# Patient Record
Sex: Female | Born: 1975 | Hispanic: No | Marital: Single | Smoking: Never smoker
Health system: Southern US, Community
[De-identification: ages and names within clinical notes are randomized; demographics above are authoritative.]

## PROBLEM LIST (undated history)

## (undated) ENCOUNTER — Inpatient Hospital Stay (HOSPITAL_COMMUNITY): Payer: Self-pay

## (undated) DIAGNOSIS — Z789 Other specified health status: Secondary | ICD-10-CM

## (undated) HISTORY — PX: NO PAST SURGERIES: SHX2092

---

## 2001-11-02 ENCOUNTER — Observation Stay (HOSPITAL_COMMUNITY): Admission: EM | Admit: 2001-11-02 | Discharge: 2001-11-02 | Payer: Self-pay | Admitting: Emergency Medicine

## 2001-11-12 ENCOUNTER — Other Ambulatory Visit: Admission: RE | Admit: 2001-11-12 | Discharge: 2001-11-12 | Payer: Self-pay | Admitting: *Deleted

## 2002-10-17 ENCOUNTER — Other Ambulatory Visit: Admission: RE | Admit: 2002-10-17 | Discharge: 2002-10-17 | Payer: Self-pay | Admitting: *Deleted

## 2003-04-11 ENCOUNTER — Inpatient Hospital Stay (HOSPITAL_COMMUNITY): Admission: AD | Admit: 2003-04-11 | Discharge: 2003-04-12 | Payer: Self-pay | Admitting: Obstetrics & Gynecology

## 2017-01-03 ENCOUNTER — Encounter: Payer: Self-pay | Admitting: Obstetrics and Gynecology

## 2017-01-03 ENCOUNTER — Ambulatory Visit (INDEPENDENT_AMBULATORY_CARE_PROVIDER_SITE_OTHER): Payer: Self-pay | Admitting: Obstetrics and Gynecology

## 2017-01-03 DIAGNOSIS — Z113 Encounter for screening for infections with a predominantly sexual mode of transmission: Secondary | ICD-10-CM

## 2017-01-03 DIAGNOSIS — Z124 Encounter for screening for malignant neoplasm of cervix: Secondary | ICD-10-CM

## 2017-01-03 DIAGNOSIS — O09521 Supervision of elderly multigravida, first trimester: Secondary | ICD-10-CM

## 2017-01-03 DIAGNOSIS — O09529 Supervision of elderly multigravida, unspecified trimester: Secondary | ICD-10-CM

## 2017-01-03 MED ORDER — ASPIRIN EC 81 MG PO TBEC: 81.0000 mg | DELAYED_RELEASE_TABLET | Freq: Every day | ORAL | 2 refills | Status: DC

## 2017-01-03 NOTE — Patient Instructions (Signed)
Primer trimestre de Media planner (First Trimester of Pregnancy) El primer trimestre de Media planner se extiende desde la semana1 hasta el final de la semana12 (mes1 al mes3). Una semana despus de que un espermatozoide fecunda un vulo, este se implantar en la pared uterina. Este embrin comenzar a Medical laboratory scientific officer convertirse en un beb. Sus genes y los de su pareja forman el beb. Los genes del varn determinan si ser un nio o una nia. Entre la semana6 y [place], se forman los ojos y [place], y los latidos del corazn pueden verse en la ecografa. Al final de las 12semanas, todos los rganos del beb estn formados. Ahora que est embarazada, querr hacer todo lo que est a su alcance para tener un beb sano. Dos de las cosas ms importantes son Lucilla Edin buena atencin prenatal y seguir las indicaciones del mdico. La atencin prenatal incluye toda la asistencia mdica que usted recibe antes del nacimiento del beb. Esta ayudar a prevenir, Hydrographic surveyor y tratar cualquier problema durante el embarazo y C[place]. CAMBIOS EN EL ORGANISMO Su organismo atraviesa por muchos cambios durante el [place], y estos varan de Ardelia Mems mujer a Theatre manager.  Al principio, puede aumentar o bajar algunos kilos.  Puede tener Higher education careers adviser (nuseas) y vomitar. Si no puede controlar los vmitos, llame al mdico.  Puede cansarse con facilidad.  Es posible que tenga dolores de cabeza que pueden aliviarse con los medicamentos que el mdico le permita tomar.  Puede orinar con mayor frecuencia. El dolor al orinar puede significar que usted tiene una infeccin de la vejiga.  Debido al Glennis Brink, puede tener acidez estomacal.  Puede estar estreida, ya que ciertas hormonas enlentecen los movimientos de los msculos que [employer] desechos a travs de los intestinos.  Pueden aparecer hemorroides o abultarse e hincharse las venas (venas varicosas).  Las [employer] pueden empezar a Engineer, site y Scientist, forensic. Los pezones  pueden sobresalir ms, y el tejido que los rodea (areola) tornarse ms oscuro.  Las Production manager y estar sensibles al cepillado y al hilo dental.  Pueden aparecer zonas oscuras o manchas (cloasma, mscara del Media planner) en el rostro que probablemente se atenuarn despus del nacimiento del beb.  Los perodos menstruales se interrumpirn.  Tal vez no tenga apetito.  Puede sentir un fuerte deseo de consumir ciertos alimentos.  Puede tener cambios a Engineer, site a da, por ejemplo, por momentos puede estar emocionada por el Media planner y por otros preocuparse porque algo pueda salir mal con el embarazo o el beb.  Tendr sueos ms vvidos y extraos.  Tal vez haya cambios en el cabello que pueden incluir su engrosamiento, crecimiento rpido y cambios en la textura. A algunas mujeres tambin se les cae el cabello durante o despus del [place], o tienen el cabello seco o fino. Lo ms probable es que el cabello se le normalice despus del nacimiento del beb. QU DEBE ESPERAR EN LAS CONSULTAS PRENATALES Durante una visita prenatal de rutina:  La pesarn para asegurarse de que usted y el beb estn creciendo normalmente.  Le controlarn la presin arterial.  Le medirn el abdomen para controlar el desarrollo del beb.  Se escucharn los latidos cardacos a partir de la semana10 o la12 de embarazo, aproximadamente.  Se analizarn los resultados de los estudios solicitados en visitas anteriores. El mdico puede preguntarle:  Cmo se siente.  Si siente los movimientos del beb.  Si ha tenido sntomas anormales, como prdida de lquido, [place], dolores de cabeza intensos o clicos abdominales.  Si est consumiendo algn producto que contenga tabaco, como cigarrillos, tabaco de Higher education careers adviser y Psychologist, sport and exercise.  Si tiene [employer]. Otros estudios que pueden realizarse durante el primer trimestre incluyen lo siguiente:  Anlisis de sangre para determinar el tipo  de sangre y Hydrographic surveyor la presencia de infecciones previas. Adems, se los usar para controlar si los niveles de hierro son bajos (anemia) y Teacher, adult education los anticuerpos Rh. En una etapa ms avanzada del [place], se harn anlisis de sangre para saber si tiene diabetes, junto con otros estudios si surgen problemas.  Anlisis de orina para detectar infecciones, diabetes o protenas en la orina.  Una ecografa para confirmar que el beb crece y se desarrolla correctamente.  Una amniocentesis para diagnosticar posibles problemas genticos.  Estudios del feto para descartar espina bfida y sndrome de [place].  Es posible que necesite otras pruebas adicionales.  Prueba del VIH (virus de inmunodeficiencia humana). Los exmenes prenatales de rutina incluyen la prueba de deteccin del VIH, a menos que decida no Radiation protection practitioner. INSTRUCCIONES PARA EL CUIDADO EN EL HOGAR Medicamentos:  Siga las indicaciones del mdico en relacin con el uso de medicamentos. Durante el embarazo, hay medicamentos que pueden tomarse y 42 que no.  Tome las vitaminas prenatales como se le indic.  Si est estreida, tome un laxante suave, si el mdico lo [place]. Dieta  Consuma alimentos balanceados. Elija alimentos variados, como carne o protenas de origen vegetal, pescado, leche y productos lcteos descremados, verduras, frutas y panes y Psychologist, prison and probation services. El mdico la ayudar a Office manager cantidad de peso que puede [place].  No coma carne cruda ni quesos sin cocinar. Estos elementos contienen bacterias que pueden causar defectos congnitos en el beb.  La ingesta diaria de cuatro o cinco comidas pequeas en lugar de tres comidas abundantes puede ayudar a [employer] nuseas y los vmitos. Si empieza a tener nuseas, comer algunas galletas saladas puede ser de [place]. Beber lquidos [employer] comidas en lugar de tomarlos durante las comidas tambin puede ayudar a Actor las nuseas y los vmitos.  Si est  estreida, consuma alimentos con alto contenido de fibra, como verduras y frutas frescas, y Psychologist, prison and probation services. Beba suficiente lquido para Consulting civil engineer orina clara o de color amarillo plido. Actividad y Conservation officer, historic buildings ejercicio solamente como se lo haya indicado el mdico. El ejercicio la ayudar a: ? Technical sales engineer. ? Mantenerse en forma. ? Estar preparada para el trabajo de parto y [place].  Los dolores, los clicos en la parte baja del abdomen o los calambres en la cintura son un buen indicio de que debe dejar de Insurance risk surveyor. Consulte al mdico antes de seguir haciendo ejercicios normales.  Intente no estar de pie [employer]. Mueva las piernas con frecuencia si debe estar de pie en un lugar durante mucho tiempo.  Evite levantar pesos [employer].  Use zapatos de tacones bajos y [place].  Puede seguir teniendo [employer], excepto que el mdico le indique lo contrario. Alivio del dolor o las molestias  Use un sostn que le brinde buen soporte si siente dolor a la palpacin [employer].  Dese baos de asiento con agua tibia para Best boy o las molestias causadas por las hemorroides. Use crema antihemorroidal si el mdico se lo permite.  Descanse con las piernas elevadas si tiene calambres o dolor de cintura.  Si tiene venas varicosas en las piernas, use medias de descanso. Eleve los pies durante 81mnutos, 3 o 4veces por  da. Limite la cantidad de sal en su dieta. Cuidados prenatales  Programe las visitas prenatales para la semana12 de [place]. Generalmente se programan cada mes al principio y se hacen ms frecuentes en los 2 ltimos meses antes del parto.  Escriba sus preguntas. Llvelas cuando concurra a las visitas prenatales.  Concurra a todas las visitas prenatales como se lo haya indicado el mdico. Seguridad  Colquese el cinturn de seguridad cuando conduzca.  Haga una lista de los nmeros de telfono de  Freight forwarder, que [employer]J's nmeros de telfono de familiares, [place], el hospital y los departamentos de polica y bomberos. Consejos generales  Pdale al mdico que la derive a clases de educacin prenatal en su localidad. Debe comenzar a tomar las clases antes de Dietitian en el mes6 de embarazo.  Pida ayuda si tiene necesidades nutricionales o de asesoramiento Solicitor. El mdico puede aconsejarla o derivarla a especialistas para que la ayuden con diferentes necesidades.  No se d baos de inmersin en agua caliente, baos turcos ni saunas.  No se haga duchas vaginales ni use tampones o toallas higinicas perfumadas.  No mantenga las piernas cruzadas durante mucho tiempo.  Evite el contacto con las bandejas sanitarias de los gatos y la tierra que estos animales usan. Estos elementos contienen bacterias que pueden causar defectos congnitos al beb y la posible prdida del feto debido a un aborto espontneo o muerte fetal.  No fume, no consuma hierbas ni medicamentos que no hayan sido recetados por el mdico. Las sustancias qumicas que estos productos contienen afectan la formacin y el desarrollo del beb.  No consuma ningn producto que contenga tabaco, lo que incluye cigarrillos, tabaco de Higher education careers adviser y Psychologist, sport and exercise. Si necesita ayuda para dejar de fumar, consulte al [employer]. Puede recibir asesoramiento y otro tipo de recursos para dejar de fumar.  Programe una cita con el dentista. En su casa, lvese los dientes con un cepillo dental blando y psese el hilo dental con suavidad. SOLICITE ATENCIN MDICA SI:  Tiene mareos.  Siente clicos leves, presin en la pelvis o dolor persistente en el abdomen.  Tiene nuseas, vmitos o diarrea persistentes.  Tiene secrecin vaginal con mal olor.  Siente dolor al [employer].  Tiene el rostro, las [place], las piernas o los tobillos ms hinchados.  SOLICITE ATENCIN MDICA DE INMEDIATO SI:  Tiene fiebre.  Tiene una prdida de  lquido por la vagina.  Tiene sangrado o pequeas prdidas vaginales.  Siente dolor intenso o clicos en el abdomen.  Sube o baja de peso rpidamente.  Vomita sangre de color rojo brillante o material que parezca granos de caf.  Ha estado expuesta a la rubola y no ha sufrido la enfermedad.  Ha estado expuesta a la quinta enfermedad o a la varicela.  Tiene un dolor de cabeza intenso.  Le falta el aire.  Sufre cualquier tipo de traumatismo, por ejemplo, debido a una cada o un accidente automovilstico.  Esta informacin no tiene Marine scientist el consejo del mdico. Asegrese de hacerle al mdico cualquier pregunta que tenga. Document Released: 12/28/2004 Document Revised: 04/10/2014 Document Reviewed: 01/28/2013 Elsevier Interactive Patient Education  2017 [employer].

## 2017-01-03 NOTE — Addendum Note (Signed)
Addended by: Chancy Milroy on: 01/03/2017 10:26 AM   Modules accepted: Orders

## 2017-01-03 NOTE — Progress Notes (Signed)
Subjective:  Dominique Caldwell is a 41 y.o. G4P3003 at 64w3dbeing seen today for first OB visit. EDD by LMP and [facility]. She has no complaints today. Adopt a Mom program. She denies any chronic medical problems or mediations. TSVD x 3 without problems.   She is currently monitored for the following issues for this high-risk pregnancy and has Encounter for supervision of high risk multigravida of advanced maternal age, antepartum on her problem list.  Patient reports no complaints.  Contractions: Not present. Vag. Bleeding: None.   . Denies leaking of fluid.   The following portions of the patient's history were reviewed and updated as appropriate: allergies, current medications, past family history, past medical history, past social history, past surgical history and problem list. Problem list updated.  Objective:   Vitals:   01/03/17 0910 01/03/17 0910  BP: (!) 99/55   Pulse: 79   Height:  4' 9"  (1.448 m)    Fetal Status:           General:  Alert, oriented and cooperative. Patient is in no acute distress.  Skin: Skin is warm and dry. No rash noted.   Cardiovascular: Normal heart rate noted  Respiratory: Normal respiratory effort, no problems with respiration noted  Abdomen: Soft, gravid, appropriate for gestational age. Pain/Pressure: Present     Pelvic:  Cervical exam performed        Extremities: Normal range of motion.  Edema: None  Mental Status: Normal mood and affect. Normal behavior. Normal judgment and thought content.  Breast Sym supple no masses, nipple d/c or adenopathy Urinalysis:      Assessment and Plan:  Pregnancy: G4P3003 at 118w3d1. Encounter for supervision of high risk multigravida of advanced maternal age, antepartum Prenatal care and labs reviewed with pt. AMA discussed. Offered Mat 21. Pt will decide after cost Advised to start BASA qd. Language Barrier interrupter services used, [id number] labor symptoms and general  obstetric precautions including but not limited to vaginal bleeding, contractions, leaking of fluid and fetal movement were reviewed in detail with the patient. Please refer to After Visit Summary for other counseling recommendations.  No Follow-up on file.   ErChancy MilroyMD

## 2017-01-04 LAB — OBSTETRIC PANEL, INCLUDING HIV
ANTIBODY SCREEN: NEGATIVE
BASOS: 0 %
Basophils Absolute: 0 10*3/uL (ref 0.0–0.2)
EOS (ABSOLUTE): 0.1 10*3/uL (ref 0.0–0.4)
Eos: 1 %
HEMATOCRIT: 36.3 % (ref 34.0–46.6)
HIV SCREEN 4TH GENERATION: NONREACTIVE
Hemoglobin: 12.4 g/dL (ref 11.1–15.9)
Hepatitis B Surface Ag: NEGATIVE
Immature Grans (Abs): 0 10*3/uL (ref 0.0–0.1)
Immature Granulocytes: 0 %
LYMPHS ABS: 1.4 10*3/uL (ref 0.7–3.1)
Lymphs: 21 %
MCH: 30.2 pg (ref 26.6–33.0)
MCHC: 34.2 g/dL (ref 31.5–35.7)
MCV: 89 fL (ref 79–97)
MONOS ABS: 0.5 10*3/uL (ref 0.1–0.9)
Monocytes: 8 %
NEUTROS ABS: 4.6 10*3/uL (ref 1.4–7.0)
Neutrophils: 70 %
Platelets: 313 10*3/uL (ref 150–379)
RBC: 4.1 x10E6/uL (ref 3.77–5.28)
RDW: 14 % (ref 12.3–15.4)
RPR Ser Ql: NONREACTIVE
Rh Factor: POSITIVE
Rubella Antibodies, IGG: 21.4 index (ref >0.99)
WBC: 6.6 10*3/uL (ref 3.4–10.8)

## 2017-01-04 LAB — VARICELLA ZOSTER ANTIBODY, IGG: VARICELLA: 1470 {index} (ref >165)

## 2017-01-06 LAB — HEMOGLOBINOPATHY EVALUATION
HEMOGLOBIN A2 QUANTITATION: 2.6 % (ref 1.8–3.2)
HGB C: 0 %
HGB S: 0 %
HGB VARIANT: 0 %
Hemoglobin F Quantitation: 0 % (ref 0.0–2.0)
Hgb A: 97.4 % (ref 96.4–98.8)

## 2017-01-08 LAB — CYTOLOGY - PAP
Bacterial vaginitis: POSITIVE — AB
CHLAMYDIA, DNA PROBE: NEGATIVE
Candida vaginitis: NEGATIVE
DIAGNOSIS: NEGATIVE
HPV (WINDOPATH): NOT DETECTED
NEISSERIA GONORRHEA: NEGATIVE
Trichomonas: NEGATIVE

## 2017-01-08 LAB — URINE CULTURE, OB REFLEX

## 2017-01-08 LAB — CULTURE, OB URINE

## 2017-01-09 ENCOUNTER — Other Ambulatory Visit: Payer: Self-pay

## 2017-01-09 ENCOUNTER — Telehealth: Payer: Self-pay

## 2017-01-09 DIAGNOSIS — B9689 Other specified bacterial agents as the cause of diseases classified elsewhere: Secondary | ICD-10-CM

## 2017-01-09 DIAGNOSIS — N760 Acute vaginitis: Principal | ICD-10-CM

## 2017-01-09 MED ORDER — SECNIDAZOLE 2 G PO PACK: 2.0000 g | PACK | Freq: Once | ORAL | 0 refills | Status: AC

## 2017-01-09 MED ORDER — METRONIDAZOLE 500 MG PO TABS: 500.0000 mg | ORAL_TABLET | Freq: Two times a day (BID) | ORAL | 0 refills | Status: AC

## 2017-01-09 NOTE — Telephone Encounter (Signed)
Pt called and requested that another rx for bv be sent to the pharmacy for her. Pt states that this rx cost $300 and she is not able to afford this. Flagyl sent to pharmacy

## 2017-01-10 LAB — CYSTIC FIBROSIS MUTATION 97: GENE DIS ANAL CARRIER INTERP BLD/T-IMP: NOT DETECTED

## 2017-01-30 ENCOUNTER — Ambulatory Visit (INDEPENDENT_AMBULATORY_CARE_PROVIDER_SITE_OTHER): Payer: Self-pay | Admitting: Certified Nurse Midwife

## 2017-01-30 VITALS — BP 106/66 | HR 86 | Wt 129.0 lb

## 2017-01-30 DIAGNOSIS — O09529 Supervision of elderly multigravida, unspecified trimester: Secondary | ICD-10-CM

## 2017-01-30 DIAGNOSIS — O09522 Supervision of elderly multigravida, second trimester: Secondary | ICD-10-CM

## 2017-01-30 NOTE — Progress Notes (Signed)
   PRENATAL VISIT NOTE  Subjective:  Madysun Thall is a 41 y.o. G4P3003 at 17w2dbeing seen today for ongoing prenatal care.  She is currently monitored for the following issues for this high-risk pregnancy and has Encounter for supervision of high risk multigravida of advanced maternal age, antepartum on her problem list.  Patient reports no complaints.  Contractions: Not present. Vag. Bleeding: None.  Movement: Present. Denies leaking of fluid.   The following portions of the patient's history were reviewed and updated as appropriate: allergies, current medications, past family history, past medical history, past social history, past surgical history and problem list. Problem list updated.  Objective:   Vitals:   01/30/17 0906  BP: 106/66  Pulse: 86  Weight: 129 lb (58.5 kg)    Fetal Status: Fetal Heart Rate (bpm): 136/145; doppler Fundal Height: 16 cm Movement: Present     General:  Alert, oriented and cooperative. Patient is in no acute distress.  Skin: Skin is warm and dry. No rash noted.   Cardiovascular: Normal heart rate noted  Respiratory: Normal respiratory effort, no problems with respiration noted  Abdomen: Soft, gravid, appropriate for gestational age.  Pain/Pressure: Absent     Pelvic: Cervical exam deferred        Extremities: Normal range of motion.  Edema: None  Mental Status:  Normal mood and affect. Normal behavior. Normal judgment and thought content.   Assessment and Plan:  Pregnancy: G4P3003 at 110w2d1. Encounter for supervision of high risk multigravida of advanced maternal age, antepartum     Doing well.  - US[place]oreaFM OB DETAIL +14 WK; Future  Preterm labor symptoms and general obstetric precautions including but not limited to vaginal bleeding, contractions, leaking of fluid and fetal movement were reviewed in detail with the patient. Please refer to After Visit Summary for other counseling recommendations.  Return in about 4 weeks (around 02/27/2017) [facility]  RaMorene CrockerCNM

## 2017-01-31 ENCOUNTER — Encounter: Payer: Self-pay | Admitting: Certified Nurse Midwife

## 2017-02-07 ENCOUNTER — Encounter (HOSPITAL_COMMUNITY): Payer: Self-pay | Admitting: Certified Nurse Midwife

## 2017-02-15 ENCOUNTER — Other Ambulatory Visit: Payer: Self-pay | Admitting: Certified Nurse Midwife

## 2017-02-15 ENCOUNTER — Other Ambulatory Visit (HOSPITAL_COMMUNITY): Payer: Self-pay | Admitting: *Deleted

## 2017-02-15 ENCOUNTER — Encounter (HOSPITAL_COMMUNITY): Payer: Self-pay

## 2017-02-15 ENCOUNTER — Ambulatory Visit (HOSPITAL_COMMUNITY)
Admission: RE | Admit: 2017-02-15 | Discharge: 2017-02-15 | Disposition: A | Discharge status: Home / Self Care | Payer: Self-pay | Source: Ambulatory Visit | Attending: Certified Nurse Midwife | Admitting: Certified Nurse Midwife

## 2017-02-15 DIAGNOSIS — Z3689 Encounter for other specified antenatal screening: Secondary | ICD-10-CM | POA: Insufficient documentation

## 2017-02-15 DIAGNOSIS — O09522 Supervision of elderly multigravida, second trimester: Secondary | ICD-10-CM | POA: Insufficient documentation

## 2017-02-15 DIAGNOSIS — Z3A18 18 weeks gestation of pregnancy: Secondary | ICD-10-CM | POA: Insufficient documentation

## 2017-02-15 DIAGNOSIS — O09529 Supervision of elderly multigravida, unspecified trimester: Secondary | ICD-10-CM

## 2017-02-15 HISTORY — DX: Other specified health status: Z78.9

## 2017-02-19 ENCOUNTER — Other Ambulatory Visit: Payer: Self-pay | Admitting: Certified Nurse Midwife

## 2017-02-19 DIAGNOSIS — O09529 Supervision of elderly multigravida, unspecified trimester: Secondary | ICD-10-CM

## 2017-02-27 ENCOUNTER — Encounter: Payer: Self-pay | Admitting: Obstetrics and Gynecology

## 2017-02-27 ENCOUNTER — Ambulatory Visit (INDEPENDENT_AMBULATORY_CARE_PROVIDER_SITE_OTHER): Payer: Self-pay | Admitting: Obstetrics and Gynecology

## 2017-02-27 VITALS — BP 103/68 | HR 73 | Wt 130.6 lb

## 2017-02-27 DIAGNOSIS — O09529 Supervision of elderly multigravida, unspecified trimester: Secondary | ICD-10-CM

## 2017-02-27 NOTE — Progress Notes (Signed)
   PRENATAL VISIT NOTE  Subjective:  Dominique Caldwell is a 41 y.o. G4P3003 at 79w2dbeing seen today for ongoing prenatal care.  She is currently monitored for the following issues for this high-risk pregnancy and has Encounter for supervision of high risk multigravida of advanced maternal age, antepartum on their problem list.  Patient reports no complaints.  Contractions: Not present. Vag. Bleeding: None.  Movement: Present. Denies leaking of fluid.   The following portions of the patient's history were reviewed and updated as appropriate: allergies, current medications, past family history, past medical history, past social history, past surgical history and problem list. Problem list updated.  Objective:   Vitals:   02/27/17 1030  BP: 103/68  Pulse: 73  Weight: 130 lb 9.6 oz (59.2 kg)    Fetal Status: Fetal Heart Rate (bpm): 133 Fundal Height: 20 cm Movement: Present     General:  Alert, oriented and cooperative. Patient is in no acute distress.  Skin: Skin is warm and dry. No rash noted.   Cardiovascular: Normal heart rate noted  Respiratory: Normal respiratory effort, no problems with respiration noted  Abdomen: Soft, gravid, appropriate for gestational age.  Pain/Pressure: Absent     Pelvic: Cervical exam deferred        Extremities: Normal range of motion.  Edema: None  Mental Status:  Normal mood and affect. Normal behavior. Normal judgment and thought content.   Assessment and Plan:  Pregnancy: G4P3003 at 236w2d1. Encounter for supervision of high risk multigravida of advanced maternal age, antepartum Patient is doing well without complaints Patient desires BTL and contact information to financial coordinator was provided  Preterm labor symptoms and general obstetric precautions including but not limited to vaginal bleeding, contractions, leaking of fluid and fetal movement were reviewed in detail with the patient. Please refer to After Visit Summary for other  counseling recommendations.  Return in about 4 weeks (around 03/27/2017) for ROB.   PeMora BellmanMD

## 2017-03-29 ENCOUNTER — Ambulatory Visit (HOSPITAL_COMMUNITY): Payer: Self-pay

## 2017-03-30 ENCOUNTER — Encounter: Payer: Self-pay | Admitting: Advanced Practice Midwife

## 2017-04-03 NOTE — L&D Delivery Note (Signed)
Operative Delivery Note At  a viable female was delivered via .  Presentation: vertex; Position: Left,, Occiput,, Anterior; Station: +2. For fetal bradycardia to 80's  Verbal consent: obtained from patient.  Risks and benefits discussed in detail.  Risks include, but are not limited to the risks of anesthesia, bleeding, infection, damage to maternal tissues, fetal cephalhematoma.  There is also the risk of inability to effect vaginal delivery of the head, or shoulder dystocia that cannot be resolved by established maneuvers, leading to the need for emergency cesarean section.  APGAR:7 , 8; weight  .   Placenta status: , .   Cord:  with the following complications: .  Cord pH: clotted per resp therapy Delivery was done by Dr. Ihor Dow. Repaie was done by myself. NICU in attendance for baby.  Anesthesia:  epidural Instruments: kiwi Episiotomy:   Lacerations:  3rd Suture Repair: 3.0 vicryl Est. Blood Loss 400(mL):    Mom to postpartum.  Baby to Couplet care / Skin to Skin.  Koren Shiver 07/15/2017, 1:05 PM

## 2017-04-09 ENCOUNTER — Encounter (HOSPITAL_COMMUNITY): Payer: Self-pay

## 2017-04-09 ENCOUNTER — Ambulatory Visit (HOSPITAL_COMMUNITY)
Admission: RE | Admit: 2017-04-09 | Discharge: 2017-04-09 | Disposition: A | Discharge status: Home / Self Care | Payer: Self-pay | Source: Ambulatory Visit | Attending: Certified Nurse Midwife | Admitting: Certified Nurse Midwife

## 2017-04-09 ENCOUNTER — Other Ambulatory Visit (HOSPITAL_COMMUNITY): Payer: Self-pay | Admitting: Maternal and Fetal Medicine

## 2017-04-09 ENCOUNTER — Encounter: Payer: Self-pay | Admitting: Certified Nurse Midwife

## 2017-04-09 DIAGNOSIS — O09522 Supervision of elderly multigravida, second trimester: Secondary | ICD-10-CM | POA: Insufficient documentation

## 2017-04-09 DIAGNOSIS — Z362 Encounter for other antenatal screening follow-up: Secondary | ICD-10-CM

## 2017-04-09 DIAGNOSIS — Z3A26 26 weeks gestation of pregnancy: Secondary | ICD-10-CM | POA: Insufficient documentation

## 2017-04-10 ENCOUNTER — Other Ambulatory Visit (HOSPITAL_COMMUNITY): Payer: Self-pay | Admitting: *Deleted

## 2017-04-10 ENCOUNTER — Ambulatory Visit (INDEPENDENT_AMBULATORY_CARE_PROVIDER_SITE_OTHER): Payer: Self-pay | Admitting: Certified Nurse Midwife

## 2017-04-10 DIAGNOSIS — O09523 Supervision of elderly multigravida, third trimester: Secondary | ICD-10-CM

## 2017-04-10 DIAGNOSIS — O09529 Supervision of elderly multigravida, unspecified trimester: Secondary | ICD-10-CM

## 2017-04-10 DIAGNOSIS — O09522 Supervision of elderly multigravida, second trimester: Secondary | ICD-10-CM

## 2017-04-10 NOTE — Progress Notes (Signed)
   PRENATAL VISIT NOTE  Subjective:  Dominique Caldwell is a 42 y.o. G4P3003 at 75w2dbeing seen today for ongoing prenatal care.  She is currently monitored for the following issues for this high-risk pregnancy and has Encounter for supervision of high risk multigravida of advanced maternal age, antepartum on their problem list.  Patient reports no complaints.  Contractions: Not present. Vag. Bleeding: None.  Movement: Present. Denies leaking of fluid.   The following portions of the patient's history were reviewed and updated as appropriate: allergies, current medications, past family history, past medical history, past social history, past surgical history and problem list. Problem list updated.  Objective:   Vitals:   04/10/17 1332  BP: 101/65  Pulse: 79  Weight: 136 lb 9.6 oz (62 kg)    Fetal Status: Fetal Heart Rate (bpm): 147; doppler Fundal Height: 26 cm Movement: Present     General:  Alert, oriented and cooperative. Patient is in no acute distress.  Skin: Skin is warm and dry. No rash noted.   Cardiovascular: Normal heart rate noted  Respiratory: Normal respiratory effort, no problems with respiration noted  Abdomen: Soft, gravid, appropriate for gestational age.  Pain/Pressure: Absent     Pelvic: Cervical exam deferred        Extremities: Normal range of motion.  Edema: None  Mental Status:  Normal mood and affect. Normal behavior. Normal judgment and thought content.   Assessment and Plan:  Pregnancy: G4P3003 at 262w2d1. Encounter for supervision of high risk multigravida of advanced maternal age, antepartum      US[place]ore[place]ults reviewed from 04/09/17, WNL.  Has f/u USKoreacheduled.  Here for exam with interpreter.  Discussed postpartum BTL.  Patient will be self pay.    Preterm labor symptoms and general obstetric precautions including but not limited to vaginal bleeding, contractions, leaking of fluid and fetal movement were reviewed in detail with the patient. Please refer to  After Visit Summary for other counseling recommendations.  Return in about 2 weeks (around 04/24/2017) [facility]   RaMorene CrockerCNM

## 2017-04-24 ENCOUNTER — Other Ambulatory Visit: Payer: Self-pay

## 2017-04-26 ENCOUNTER — Other Ambulatory Visit: Payer: Self-pay

## 2017-04-26 ENCOUNTER — Ambulatory Visit (INDEPENDENT_AMBULATORY_CARE_PROVIDER_SITE_OTHER): Payer: Self-pay | Admitting: Certified Nurse Midwife

## 2017-04-26 ENCOUNTER — Encounter: Payer: Self-pay | Admitting: Certified Nurse Midwife

## 2017-04-26 VITALS — BP 107/70 | HR 89 | Wt 138.0 lb

## 2017-04-26 DIAGNOSIS — O09529 Supervision of elderly multigravida, unspecified trimester: Secondary | ICD-10-CM

## 2017-04-26 DIAGNOSIS — Z23 Encounter for immunization: Secondary | ICD-10-CM

## 2017-04-26 NOTE — Progress Notes (Signed)
Pt states she started having pelvic pain when she walks yesterday.

## 2017-04-26 NOTE — Patient Instructions (Addendum)
Informacin sobre parto y [place] de parto prematuros Preterm Labor and Birth Information El embarazo tiene generalmente una duracin de 39 a 41 semanas. El [place] de parto es prematuro cuando se inicia muy pronto. Comienza antes de completar las 37 semanas de [place]. Cules son los factores de riesgo del [place] de [place]ce prematuro? Existen mayores probabilidades de trabajo de parto prematuro en mujeres con las siguientes caractersticas:  Tuvieron una infeccin Solicitor.  El cuello uterino es corto.  Tuvieron trabajo de parto prematuro anteriormente.  Se sometieron a una ciruga en el cuello uterino.  Son menores de 17aos.  Tienen ms de 35aos.  Son afroamericanas.  Estn embarazadas de dos o ms bebs.  Consumen drogas mientras estn embarazadas.  Fuman mientras estn embarazadas.  No aumentan de peso lo suficiente durante el [employer].  Se embarazaron inmediatamente despus de Psychologist, clinical.  Cules son los sntomas del [place] de [place] prematuro? Los sntomas del trabajo de parto prematuro incluyen lo siguiente:  Marketing executive. Los calambres pueden parecerse a los que tiene una mujer durante el perodo menstrual. Los calambres pueden presentarse con diarrea.  Dolor de vientre (abdomen).  Dolor en la zona lumbar.  Tiene contracciones regulares o endurecimiento del tero. Siente como si el vientre se endurece.  Presin en la zona inferior del vientre que Futures trader.  Pierde ms lquido (secrecin) por la vagina. El lquido puede ser acuoso o con [place].  Ruptura de la bolsa de aguas.  Por qu es importante notar los signos del [place] de [place] prematuro? Los bebs que nacen antes de tiempo pueden no estar completamente desarrollados. Estos pueden tener un riesgo mayor de padecer:  Problemas cardacos a Barrister's clerk.  Problemas pulmonares a Barrister's clerk.  Dificultades para controlar los sistemas corporales, por ejemplo, respirar.  Hemorragia  cerebral.  Una afeccin que se denomina parlisis cerebral.  Dificultades en el aprendizaje.  Muerte.  Estos riesgos son [employer] para bebs que nacen antes de las 34semanas de [place]. Cmo se trata Leander Rams de parto prematuro? El tratamiento depende de lo siguiente:  El tiempo de [place].  Su estado de [place].  La salud del beb.  El tratamiento puede incluir lo siguiente:  Un punto (sutura) en el cuello uterino. Al parir, el cuello uterino se abre para que el beb pueda salir. El punto impide que el cuello uterino se abra antes de [place].  Permanecer en el hospital.  Tomar medicamentos como, por ejemplo: ? Medicamentos hormonales. ? Medicamentos para Scientist, water quality las contracciones. ? Medicamentos para ayudar a la maduracin de los pulmones del beb. ? Medicamentos para evitar que el beb desarrolle parlisis cerebral.  Qu debo hacer si estoy en [place] prematuro? Si cree que est en trabajo de parto demasiado pronto, llame a su mdico de inmediato. Cmo puedo prevenir el trabajo de parto prematuro?  No use productos que contengan tabaco. ? Estos incluyen cigarrillos, tabaco para Higher education careers adviser y Psychologist, sport and exercise. ? Si necesita ayuda para dejar de fumar, consulte al mdico.  No consuma drogas.  No tome ningn medicamento si el mdico no se lo indic.  Consulte al mdico antes de empezar a tomar cualquier suplemento de hierbas.  Asegrese aumentar de peso como corresponde.  Tenga cuidado con las infecciones. Si cree que puede tener una infeccin, consulte al mdico para que la revisen inmediatamente.  Infrmele al mdico si ha tenido trabajo de parto prematuro anteriormente. Esta informacin no tiene Marine scientist el consejo del mdico. Asegrese de hacerle al mdico  cualquier pregunta que tenga. Document Released: 04/22/2010 Document Revised: 06/28/2016 Document Reviewed: 08/11/2015 Elsevier Interactive Patient Education  2018 [place] of Pregnancy The third trimester is from week 29 through week 42, months 7 through 9. This trimester is when your unborn baby (fetus) is growing very fast. At the end of the ninth month, the unborn baby is about 20 inches in length. It weighs about 6-10 pounds. Follow these instructions at home:  Avoid all smoking, herbs, and alcohol. Avoid drugs not approved by your doctor.  Do not use any tobacco products, including cigarettes, chewing tobacco, and electronic cigarettes. If you need help quitting, ask your doctor. You may get counseling or other support to help you quit.  Only take medicine as told by your doctor. Some medicines are safe and some are not during pregnancy.  Exercise only as told by your doctor. Stop exercising if you start having cramps.  Eat regular, healthy meals.  Wear a good support bra if your breasts are tender.  Do not use hot tubs, steam rooms, or saunas.  Wear your seat belt when driving.  Avoid raw meat, uncooked cheese, and liter boxes and soil used by cats.  Take your prenatal vitamins.  Take 1500-2000 milligrams of calcium daily starting at the 20th week of pregnancy until you deliver your baby.  Try taking medicine that helps you poop (stool softener) as needed, and if your doctor approves. Eat more fiber by eating fresh fruit, vegetables, and whole grains. Drink enough fluids to keep your pee (urine) clear or pale yellow.  Take warm water baths (sitz baths) to soothe pain or discomfort caused by hemorrhoids. Use hemorrhoid cream if your doctor approves.  If you have puffy, bulging veins (varicose veins), wear support hose. Raise (elevate) your feet for 15 minutes, 3-4 times a day. Limit salt in your diet.  Avoid heavy lifting, wear low heels, and sit up straight.  Rest with your legs raised if you have leg cramps or low back pain.  Visit your dentist if you have not gone during your pregnancy. Use a soft toothbrush to brush  your teeth. Be gentle when you floss.  You can have sex (intercourse) unless your doctor tells you not to.  Do not travel far distances unless you must. Only do so with your doctor's approval.  Take prenatal classes.  Practice driving to the hospital.  Pack your hospital bag.  Prepare the baby's room.  Go to your doctor visits. Get help if:  You are not sure if you are in labor or if your water has broken.  You are dizzy.  You have mild cramps or pressure in your lower belly (abdominal).  You have a nagging pain in your belly area.  You continue to feel sick to your stomach (nauseous), throw up (vomit), or have watery poop (diarrhea).  You have bad smelling fluid coming from your vagina.  You have pain with peeing (urination). Get help right away if:  You have a fever.  You are leaking fluid from your vagina.  You are spotting or bleeding from your vagina.  You have severe belly cramping or pain.  You lose or gain weight rapidly.  You have trouble catching your breath and have chest pain.  You notice sudden or extreme puffiness (swelling) of your face, hands, ankles, feet, or legs.  You have not felt the baby move in over an hour.  You have severe headaches that do not go away with  medicine.  You have vision changes. This information is not intended to replace advice given to you by your health care provider. Make sure you discuss any questions you have with your health care provider. Document Released: 06/14/2009 Document Revised: 08/26/2015 Document Reviewed: 05/21/2012 Elsevier Interactive Patient Education  2017 [place] trimestre de Media planner (Third Trimester of Pregnancy) El tercer trimestre comprende desde la [id number] hasta la [id number], es decir, desde el mes7 hasta el mes9. En este trimestre, el feto crece muy rpido. Hacia el final del noveno mes, el feto mide alrededor de 20pulgadas (45cm) de largo y pesa entre 6y 10libras  [phone]). CUIDADOS EN EL HOGAR  No fume, no consuma hierbas ni beba alcohol. No tome frmacos que el mdico no haya autorizado.  No consuma ningn producto que contenga tabaco, lo que incluye cigarrillos, tabaco de Higher education careers adviser o Psychologist, sport and exercise. Si necesita ayuda para dejar de fumar, consulte al [employer]. Puede recibir asesoramiento u otro tipo de apoyo para dejar de fumar.  Tome los medicamentos solamente como se lo haya indicado el mdico. Algunos medicamentos son seguros para tomar durante el Media planner y otros no lo son.  Haga ejercicios solamente como se lo haya indicado el mdico. Interrumpa la actividad fsica si comienza a tener calambres.  Ingiera alimentos saludables de [place] regular.  Use un sostn que le brinde buen soporte si sus mamas estn sensibles.  No se d baos de inmersin en agua caliente, baos turcos ni saunas.  Colquese el cinturn de seguridad cuando conduzca.  No coma carne cruda ni queso sin cocinar; evite el contacto con las bandejas sanitarias de los gatos y la tierra que estos animales usan.  [place].  Tome entre 1500 y 2031m de calcio diariamente comenzando en la [id number] embarazo hPortal  Pruebe tomar un medicamento que la ayude a defecar (un laxante suave) si el mdico lo autoriza. Consuma ms fibra, que se encuentra en las frutas y verduras frescas y los cereales integrales. Beba suficiente lquido para mantener el pis (orina) claro o de color amarillo plido.  Dese baos de asiento con agua tibia para aBest boyo las molestias causadas por las hemorroides. Use una crema para las hemorroides si el mdico la autoriza.  Si se le hinchan las venas (venas varicosas), use medias de descanso. Levante (eleve) los pies durante 136mutos, 3 o 4veces por daTraining and development officerLimite el consumo de sal en su dieta.  No levante objetos pesados, use zapatos de tacones bajos y sintese derecha.  Descanse con las piernas  elevadas si tiene calambres o dolor de cintura.  Visite a su dentista si no lo ha heQuarry managerUse un cepillo de cerdas suaves para cepillarse los dientes. Psese el hilo dental con suavidad.  Puede seguir maAmerican Electric Powera menos que el mdico le indique lo contrario.  No haga viajes de larga distancia, excepto si es obligatorio y solamente con la aprobacin del mdico.  Tome clases prenatales.  Practique ir manejando al hospital.  Prepare el bolso que llevar al hospital.  Prepare la habitacin del beb.  Concurra a los controles mdicos.  SOLICITE AYUDA SI:  No est segura de si est en trabajo de parto o si ha roto la bolsa de las aguas.  Tiene mareos.  Siente calambres leves o presin en la parte inferior del abdomen.  Sufre un dolor persistente en el abdomen.  Tiene maHigher education careers advisernuseas), vmitos, o tiene deposiciones acuosas (diarrea).  Advierte un  olor ftido que proviene de la vagina.  Siente dolor al [employer].  SOLICITE AYUDA DE INMEDIATO SI:  Tiene fiebre.  Tiene una prdida de lquido por la vagina.  Tiene sangrado o pequeas prdidas vaginales.  Siente dolor intenso o clicos en el abdomen.  Sube o baja de peso rpidamente.  Tiene dificultades para recuperar el aliento y siente dolor en el pecho.  Sbitamente se le hinchan mucho el rostro, las [place], los tobillos, los pies o las piernas.  No ha sentido los movimientos del beb durante [employer].  Siente un dolor de cabeza intenso que no se alivia con medicamentos.  Su visin se modifica.  Esta informacin no tiene Marine scientist el consejo del mdico. Asegrese de hacerle al mdico cualquier pregunta que tenga. Document Released: 11/20/2012 Document Revised: 04/10/2014 Document Reviewed: 05/21/2012 Elsevier Interactive Patient Education  2017 [employer].

## 2017-04-26 NOTE — Progress Notes (Signed)
   PRENATAL VISIT NOTE  Subjective:  Dominique Caldwell is a 42 y.o. G4P3003 at 59w4dbeing seen today for ongoing prenatal care.  She is currently monitored for the following issues for this high-risk pregnancy and has Encounter for supervision of high risk multigravida of advanced maternal age, antepartum on their problem list.  Patient reports no complaints.  Contractions: Not present. Vag. Bleeding: None.  Movement: Present. Denies leaking of fluid.   The following portions of the patient's history were reviewed and updated as appropriate: allergies, current medications, past family history, past medical history, past social history, past surgical history and problem list. Problem list updated.  Objective:   Vitals:   04/26/17 0838  BP: 107/70  Pulse: 89  Weight: 138 lb (62.6 kg)    Fetal Status: Fetal Heart Rate (bpm): 125; doppler Fundal Height: 28 cm Movement: Present     General:  Alert, oriented and cooperative. Patient is in no acute distress.  Skin: Skin is warm and dry. No rash noted.   Cardiovascular: Normal heart rate noted  Respiratory: Normal respiratory effort, no problems with respiration noted  Abdomen: Soft, gravid, appropriate for gestational age.  Pain/Pressure: Present     Pelvic: Cervical exam deferred        Extremities: Normal range of motion.  Edema: None  Mental Status:  Normal mood and affect. Normal behavior. Normal judgment and thought content.   Assessment and Plan:  Pregnancy: G4P3003 at 265w4d1. Encounter for supervision of high risk multigravida of advanced maternal age, antepartum     Doing well.  Interpreter here for exam.  Kick counts discussed.  - Glucose Tolerance, 2 Hours w/1 Hour - CBC - HIV antibody - RPR  2. Need for Tdap vaccination     Letter written for HD.   - Tdap vaccine greater than or equal to 7yo IM  Preterm labor symptoms and general obstetric precautions including but not limited to vaginal bleeding, contractions, leaking  of fluid and fetal movement were reviewed in detail with the patient. Please refer to After Visit Summary for other counseling recommendations.  Return in about 2 weeks (around 05/10/2017) for ROQuitmanHOEdgewood  RaMorene CrockerCNM

## 2017-04-27 ENCOUNTER — Other Ambulatory Visit: Payer: Self-pay | Admitting: Certified Nurse Midwife

## 2017-04-27 DIAGNOSIS — O09529 Supervision of elderly multigravida, unspecified trimester: Secondary | ICD-10-CM

## 2017-04-27 LAB — RPR: RPR: NONREACTIVE

## 2017-04-27 LAB — CBC
HEMATOCRIT: 35.9 % (ref 34.0–46.6)
HEMOGLOBIN: 12.1 g/dL (ref 11.1–15.9)
MCH: 30.9 pg (ref 26.6–33.0)
MCHC: 33.7 g/dL (ref 31.5–35.7)
MCV: 92 fL (ref 79–97)
Platelets: 290 10*3/uL (ref 150–379)
RBC: 3.91 x10E6/uL (ref 3.77–5.28)
RDW: 14.5 % (ref 12.3–15.4)
WBC: 6.7 10*3/uL (ref 3.4–10.8)

## 2017-04-27 LAB — GLUCOSE TOLERANCE, 2 HOURS W/ 1HR
GLUCOSE, 2 HOUR: 106 mg/dL (ref 65–152)
Glucose, 1 hour: 134 mg/dL (ref 65–179)
Glucose, Fasting: 81 mg/dL (ref 65–91)

## 2017-04-27 LAB — HIV ANTIBODY (ROUTINE TESTING W REFLEX): HIV Screen 4th Generation wRfx: NONREACTIVE

## 2017-05-08 ENCOUNTER — Ambulatory Visit (INDEPENDENT_AMBULATORY_CARE_PROVIDER_SITE_OTHER): Payer: Self-pay | Admitting: Obstetrics and Gynecology

## 2017-05-08 ENCOUNTER — Encounter: Payer: Self-pay | Admitting: Obstetrics and Gynecology

## 2017-05-08 DIAGNOSIS — Z3009 Encounter for other general counseling and advice on contraception: Secondary | ICD-10-CM

## 2017-05-08 DIAGNOSIS — O09523 Supervision of elderly multigravida, third trimester: Secondary | ICD-10-CM

## 2017-05-08 DIAGNOSIS — O09529 Supervision of elderly multigravida, unspecified trimester: Secondary | ICD-10-CM

## 2017-05-08 NOTE — Patient Instructions (Signed)
Tercer trimestre de Media planner (Third Trimester of Pregnancy) El tercer trimestre comprende desde la [id number] hasta la [id number], es decir, desde el mes7 hasta el mes9. En este trimestre, el feto crece muy rpido. Hacia el final del noveno mes, el feto mide alrededor de 20pulgadas (45cm) de largo y pesa entre 6y 10libras [phone]). CUIDADOS EN EL HOGAR  No fume, no consuma hierbas ni beba alcohol. No tome frmacos que el mdico no haya autorizado.  No consuma ningn producto que contenga tabaco, lo que incluye cigarrillos, tabaco de Higher education careers adviser o Psychologist, sport and exercise. Si necesita ayuda para dejar de fumar, consulte al [employer]. Puede recibir asesoramiento u otro tipo de apoyo para dejar de fumar.  Tome los medicamentos solamente como se lo haya indicado el mdico. Algunos medicamentos son seguros para tomar durante el Media planner y otros no lo son.  Haga ejercicios solamente como se lo haya indicado el mdico. Interrumpa la actividad fsica si comienza a tener calambres.  Ingiera alimentos saludables de [place] regular.  Use un sostn que le brinde buen soporte si sus mamas estn sensibles.  No se d baos de inmersin en agua caliente, baos turcos ni saunas.  Colquese el cinturn de seguridad cuando conduzca.  No coma carne cruda ni queso sin cocinar; evite el contacto con las bandejas sanitarias de los gatos y la tierra que estos animales usan.  [place].  Tome entre 1500 y 2030m de calcio diariamente comenzando en la [id number] embarazo hCreola  Pruebe tomar un medicamento que la ayude a defecar (un laxante suave) si el mdico lo autoriza. Consuma ms fibra, que se encuentra en las frutas y verduras frescas y los cereales integrales. Beba suficiente lquido para mantener el pis (orina) claro o de color amarillo plido.  Dese baos de asiento con agua tibia para aBest boyo las molestias causadas por las hemorroides. Use una crema para  las hemorroides si el mdico la autoriza.  Si se le hinchan las venas (venas varicosas), use medias de descanso. Levante (eleve) los pies durante 118mutos, 3 o 4veces por daTraining and development officerLimite el consumo de sal en su dieta.  No levante objetos pesados, use zapatos de tacones bajos y sintese derecha.  Descanse con las piernas elevadas si tiene calambres o dolor de cintura.  Visite a su dentista si no lo ha heQuarry managerUse un cepillo de cerdas suaves para cepillarse los dientes. Psese el hilo dental con suavidad.  Puede seguir maAmerican Electric Powera menos que el mdico le indique lo contrario.  No haga viajes de larga distancia, excepto si es obligatorio y solamente con la aprobacin del mdico.  Tome clases prenatales.  Practique ir manejando al hospital.  Prepare el bolso que llevar al hospital.  Prepare la habitacin del beb.  Concurra a los controles mdicos.  SOLICITE AYUDA SI:  No est segura de si est en trabajo de parto o si ha roto la bolsa de las aguas.  Tiene mareos.  Siente calambres leves o presin en la parte inferior del abdomen.  Sufre un dolor persistente en el abdomen.  Tiene maHigher education careers advisernuseas), vmitos, o tiene deposiciones acuosas (diarrea).  Advierte un olor ftido que proviene de la vagina.  Siente dolor al orContinental Airlines SOLICITE AYUDA DE INMEDIATO SI:  Tiene fiebre.  Tiene una prdida de lquido por la vagina.  Tiene sangrado o pequeas prdidas vaginales.  Siente dolor intenso o clicos en el abdomen.  Sube o baja de peso rpidamente.  Tiene dificultades para recuperar el aliento y siente dolor en el pecho.  Sbitamente se le hinchan mucho el rostro, las [place], los tobillos, los pies o las piernas.  No ha sentido los movimientos del beb durante [employer].  Siente un dolor de cabeza intenso que no se alivia con medicamentos.  Su visin se modifica.  Esta informacin no tiene Marine scientist el consejo  del mdico. Asegrese de hacerle al mdico cualquier pregunta que tenga. Document Released: 11/20/2012 Document Revised: 04/10/2014 Document Reviewed: 05/21/2012 Elsevier Interactive Patient Education  2017 [employer].

## 2017-05-08 NOTE — Progress Notes (Signed)
Subjective:  Dominique Caldwell is a 42 y.o. [phone] at 42w2dbeing seen today for ongoing prenatal care.  She is currently monitored for the following issues for this high-risk pregnancy and has Encounter for supervision of high risk multigravida of advanced maternal age, antepartum and Unwanted fertility on their problem list.  Patient reports no complaints.  Contractions: Not present. Vag. Bleeding: None.  Movement: Present. Denies leaking of fluid.   The following portions of the patient's history were reviewed and updated as appropriate: allergies, current medications, past family history, past medical history, past social history, past surgical history and problem list. Problem list updated.  Objective:   Vitals:   05/08/17 0852  BP: 104/70  Pulse: 87  Weight: 141 lb 12.8 oz (64.3 kg)    Fetal Status:     Movement: Present     General:  Alert, oriented and cooperative. Patient is in no acute distress.  Skin: Skin is warm and dry. No rash noted.   Cardiovascular: Normal heart rate noted  Respiratory: Normal respiratory effort, no problems with respiration noted  Abdomen: Soft, gravid, appropriate for gestational age. Pain/Pressure: Absent     Pelvic:  Cervical exam deferred        Extremities: Normal range of motion.  Edema: Trace  Mental Status: Normal mood and affect. Normal behavior. Normal judgment and thought content.   Urinalysis:      Assessment and Plan:  Pregnancy: G4P3003 at 351w2d1. Encounter for supervision of high risk multigravida of advanced maternal age, antepartum Stable Growth scan 05/21/17  2. Unwanted fertility BTL papers signed today. Pt aware of self pay  Preterm labor symptoms and general obstetric precautions including but not limited to vaginal bleeding, contractions, leaking of fluid and fetal movement were reviewed in detail with the patient. Please refer to After Visit Summary for other counseling recommendations.  Return in about 2 weeks (around  05/22/2017) for OB visit.   ErChancy MilroyMD

## 2017-05-21 ENCOUNTER — Other Ambulatory Visit (HOSPITAL_COMMUNITY): Payer: Self-pay | Admitting: *Deleted

## 2017-05-21 ENCOUNTER — Other Ambulatory Visit (HOSPITAL_COMMUNITY): Payer: Self-pay | Admitting: Maternal and Fetal Medicine

## 2017-05-21 ENCOUNTER — Encounter (HOSPITAL_COMMUNITY): Payer: Self-pay

## 2017-05-21 ENCOUNTER — Ambulatory Visit (HOSPITAL_COMMUNITY)
Admission: RE | Admit: 2017-05-21 | Discharge: 2017-05-21 | Disposition: A | Discharge status: Home / Self Care | Payer: Self-pay | Source: Ambulatory Visit | Attending: Certified Nurse Midwife | Admitting: Certified Nurse Midwife

## 2017-05-21 DIAGNOSIS — O09529 Supervision of elderly multigravida, unspecified trimester: Secondary | ICD-10-CM

## 2017-05-21 DIAGNOSIS — Z362 Encounter for other antenatal screening follow-up: Secondary | ICD-10-CM

## 2017-05-21 DIAGNOSIS — Z3A32 32 weeks gestation of pregnancy: Secondary | ICD-10-CM

## 2017-05-21 DIAGNOSIS — O09523 Supervision of elderly multigravida, third trimester: Secondary | ICD-10-CM

## 2017-05-22 ENCOUNTER — Ambulatory Visit (INDEPENDENT_AMBULATORY_CARE_PROVIDER_SITE_OTHER): Payer: Self-pay | Admitting: Obstetrics & Gynecology

## 2017-05-22 DIAGNOSIS — O09529 Supervision of elderly multigravida, unspecified trimester: Secondary | ICD-10-CM

## 2017-05-22 NOTE — Progress Notes (Signed)
   PRENATAL VISIT NOTE  Subjective:  Dominique Caldwell is a 42 y.o. G4P3003 at 93w2dbeing seen today for ongoing prenatal care.  She is currently monitored for the following issues for this high-risk pregnancy and has Encounter for supervision of high risk multigravida of advanced maternal age, antepartum and Unwanted fertility on their problem list.  Patient reports no complaints.  Contractions: Not present. Vag. Bleeding: None.  Movement: Present. Denies leaking of fluid.   The following portions of the patient's history were reviewed and updated as appropriate: allergies, current medications, past family history, past medical history, past social history, past surgical history and problem list. Problem list updated.  Objective:   Vitals:   05/22/17 1009  BP: 110/72  Pulse: 88  Weight: 142 lb 8 oz (64.6 kg)    Fetal Status: Fetal Heart Rate (bpm): 136-doppler   Movement: Present     General:  Alert, oriented and cooperative. Patient is in no acute distress.  Skin: Skin is warm and dry. No rash noted.   Cardiovascular: Normal heart rate noted  Respiratory: Normal respiratory effort, no problems with respiration noted  Abdomen: Soft, gravid, appropriate for gestational age.  Pain/Pressure: Absent     Pelvic: Cervical exam deferred        Extremities: Normal range of motion.  Edema: None  Mental Status:  Normal mood and affect. Normal behavior. Normal judgment and thought content.   Assessment and Plan:  Pregnancy: G4P3003 at 342w2d1. Encounter for supervision of high risk multigravida of advanced maternal age, antepartum Normal growth USKoreaesterday  Preterm labor symptoms and general obstetric precautions including but not limited to vaginal bleeding, contractions, leaking of fluid and fetal movement were reviewed in detail with the patient. Please refer to After Visit Summary for other counseling recommendations.  Return in about 2 weeks (around 06/05/2017).   JaEmeterio Reeve MD

## 2017-05-22 NOTE — Patient Instructions (Signed)
Tercer trimestre de Media planner (Third Trimester of Pregnancy) El tercer trimestre comprende desde la [id number] hasta la [id number], es decir, desde el mes7 hasta el mes9. En este trimestre, el feto crece muy rpido. Hacia el final del noveno mes, el feto mide alrededor de 20pulgadas (45cm) de largo y pesa entre 6y 10libras [phone]). CUIDADOS EN EL HOGAR  No fume, no consuma hierbas ni beba alcohol. No tome frmacos que el mdico no haya autorizado.  No consuma ningn producto que contenga tabaco, lo que incluye cigarrillos, tabaco de Higher education careers adviser o Psychologist, sport and exercise. Si necesita ayuda para dejar de fumar, consulte al [employer]. Puede recibir asesoramiento u otro tipo de apoyo para dejar de fumar.  Tome los medicamentos solamente como se lo haya indicado el mdico. Algunos medicamentos son seguros para tomar durante el Media planner y otros no lo son.  Haga ejercicios solamente como se lo haya indicado el mdico. Interrumpa la actividad fsica si comienza a tener calambres.  Ingiera alimentos saludables de [place] regular.  Use un sostn que le brinde buen soporte si sus mamas estn sensibles.  No se d baos de inmersin en agua caliente, baos turcos ni saunas.  Colquese el cinturn de seguridad cuando conduzca.  No coma carne cruda ni queso sin cocinar; evite el contacto con las bandejas sanitarias de los gatos y la tierra que estos animales usan.  [place].  Tome entre 1500 y 2070m de calcio diariamente comenzando en la [id number] embarazo [place]  Pruebe tomar un medicamento que la ayude a defecar (un laxante suave) si el mdico lo autoriza. Consuma ms fibra, que se encuentra en las frutas y verduras frescas y los cereales integrales. Beba suficiente lquido para mantener el pis (orina) claro o de color amarillo plido.  Dese baos de asiento con agua tibia para aBest boyo las molestias causadas por las hemorroides. Use una crema para  las hemorroides si el mdico la autoriza.  Si se le hinchan las venas (venas varicosas), use medias de descanso. Levante (eleve) los pies durante 114mutos, 3 o 4veces por daTraining and development officerLimite el consumo de sal en su dieta.  No levante objetos pesados, use zapatos de tacones bajos y sintese derecha.  Descanse con las piernas elevadas si tiene calambres o dolor de cintura.  Visite a su dentista si no lo ha heQuarry managerUse un cepillo de cerdas suaves para cepillarse los dientes. Psese el hilo dental con suavidad.  Puede seguir maAmerican Electric Powera menos que el mdico le indique lo contrario.  No haga viajes de larga distancia, excepto si es obligatorio y solamente con la aprobacin del mdico.  Tome clases prenatales.  Practique ir manejando al hospital.  Prepare el bolso que llevar al hospital.  Prepare la habitacin del beb.  Concurra a los controles mdicos.  SOLICITE AYUDA SI:  No est segura de si est en trabajo de parto o si ha roto la bolsa de las aguas.  Tiene mareos.  Siente calambres leves o presin en la parte inferior del abdomen.  Sufre un dolor persistente en el abdomen.  Tiene maHigher education careers advisernuseas), vmitos, o tiene deposiciones acuosas (diarrea).  Advierte un olor ftido que proviene de la vagina.  Siente dolor al orContinental Airlines SOLICITE AYUDA DE INMEDIATO SI:  Tiene fiebre.  Tiene una prdida de lquido por la vagina.  Tiene sangrado o pequeas prdidas vaginales.  Siente dolor intenso o clicos en el abdomen.  Sube o baja de peso rpidamente.  Tiene dificultades para recuperar el aliento y siente dolor en el pecho.  Sbitamente se le hinchan mucho el rostro, las [place], los tobillos, los pies o las piernas.  No ha sentido los movimientos del beb durante [employer].  Siente un dolor de cabeza intenso que no se alivia con medicamentos.  Su visin se modifica.  Esta informacin no tiene Marine scientist el consejo  del mdico. Asegrese de hacerle al mdico cualquier pregunta que tenga. Document Released: 11/20/2012 Document Revised: 04/10/2014 Document Reviewed: 05/21/2012 Elsevier Interactive Patient Education  2017 [employer].

## 2017-05-22 NOTE — Progress Notes (Signed)
Pt denies concerns today

## 2017-06-05 ENCOUNTER — Ambulatory Visit (INDEPENDENT_AMBULATORY_CARE_PROVIDER_SITE_OTHER): Payer: Self-pay | Admitting: Obstetrics & Gynecology

## 2017-06-05 VITALS — BP 111/71 | HR 92 | Wt 142.0 lb

## 2017-06-05 DIAGNOSIS — O09523 Supervision of elderly multigravida, third trimester: Secondary | ICD-10-CM

## 2017-06-05 DIAGNOSIS — R300 Dysuria: Secondary | ICD-10-CM

## 2017-06-05 DIAGNOSIS — O09529 Supervision of elderly multigravida, unspecified trimester: Secondary | ICD-10-CM | POA: Insufficient documentation

## 2017-06-05 LAB — POCT URINALYSIS DIPSTICK
Bilirubin, UA: NEGATIVE
GLUCOSE UA: NEGATIVE
Ketones, UA: NEGATIVE
Nitrite, UA: NEGATIVE
PROTEIN UA: NEGATIVE
Urobilinogen, UA: 0.2 E.U./dL
pH, UA: 6.5 (ref 5.0–8.0)

## 2017-06-05 MED ORDER — SULFAMETHOXAZOLE-TRIMETHOPRIM 800-160 MG PO TABS: 1.0000 | ORAL_TABLET | Freq: Two times a day (BID) | ORAL | 0 refills | Status: DC

## 2017-06-05 NOTE — Progress Notes (Signed)
   PRENATAL VISIT NOTE  Subjective:  Dominique Caldwell is a 42 y.o. G4P3003 at 28w2dbeing seen today for ongoing prenatal care.  She is currently monitored for the following issues for this high-risk pregnancy and has Encounter for supervision of high risk multigravida of advanced maternal age, antepartum and Unwanted fertility on their problem list.  Patient reports dysuria.  Contractions: Not present. Vag. Bleeding: None.  Movement: Present. Denies leaking of fluid.   The following portions of the patient's history were reviewed and updated as appropriate: allergies, current medications, past family history, past medical history, past social history, past surgical history and problem list. Problem list updated.  Objective:   Vitals:   06/05/17 0834  BP: 111/71  Pulse: 92  Weight: 142 lb (64.4 kg)    Fetal Status: Fetal Heart Rate (bpm): 134 Fundal Height: 34 cm Movement: Present     General:  Alert, oriented and cooperative. Patient is in no acute distress.  Skin: Skin is warm and dry. No rash noted.   Cardiovascular: Normal heart rate noted  Respiratory: Normal respiratory effort, no problems with respiration noted  Abdomen: Soft, gravid, appropriate for gestational age.  Pain/Pressure: Absent     Pelvic: Cervical exam deferred        Extremities: Normal range of motion.  Edema: None  Mental Status:  Normal mood and affect. Normal behavior. Normal judgment and thought content.   Assessment and Plan:  Pregnancy: G4P3003 at 362w2d1. Encounter for supervision of high risk multigravida of advanced maternal age, antepartum Nl growth  2. Dysuria C/w UTI - POCT Urinalysis Dipstick - Urine Culture - sulfamethoxazole-trimethoprim (BACTRIM DS,SEPTRA DS) 800-160 MG tablet; Take 1 tablet by mouth 2 (two) times daily.  Dispense: 14 tablet; Refill: 0  Preterm labor symptoms and general obstetric precautions including but not limited to vaginal bleeding, contractions, leaking of fluid  and fetal movement were reviewed in detail with the patient. Please refer to After Visit Summary for other counseling recommendations.  Return in about 1 week (around 06/12/2017) for NST AFI.   JaEmeterio ReeveMD

## 2017-06-05 NOTE — Patient Instructions (Signed)
Tercer trimestre de Media planner (Third Trimester of Pregnancy) El tercer trimestre comprende desde la [id number] hasta la [id number], es decir, desde el mes7 hasta el mes9. En este trimestre, el feto crece muy rpido. Hacia el final del noveno mes, el feto mide alrededor de 20pulgadas (45cm) de largo y pesa entre 6y 10libras [phone]). CUIDADOS EN EL HOGAR  No fume, no consuma hierbas ni beba alcohol. No tome frmacos que el mdico no haya autorizado.  No consuma ningn producto que contenga tabaco, lo que incluye cigarrillos, tabaco de Higher education careers adviser o Psychologist, sport and exercise. Si necesita ayuda para dejar de fumar, consulte al [employer]. Puede recibir asesoramiento u otro tipo de apoyo para dejar de fumar.  Tome los medicamentos solamente como se lo haya indicado el mdico. Algunos medicamentos son seguros para tomar durante el Media planner y otros no lo son.  Haga ejercicios solamente como se lo haya indicado el mdico. Interrumpa la actividad fsica si comienza a tener calambres.  Ingiera alimentos saludables de [place] regular.  Use un sostn que le brinde buen soporte si sus mamas estn sensibles.  No se d baos de inmersin en agua caliente, baos turcos ni saunas.  Colquese el cinturn de seguridad cuando conduzca.  No coma carne cruda ni queso sin cocinar; evite el contacto con las bandejas sanitarias de los gatos y la tierra que estos animales usan.  [place].  Tome entre 1500 y 2031m de calcio diariamente comenzando en la [id number] embarazo hW[place]  Pruebe tomar un medicamento que la ayude a defecar (un laxante suave) si el mdico lo autoriza. Consuma ms fibra, que se encuentra en las frutas y verduras frescas y los cereales integrales. Beba suficiente lquido para mantener el pis (orina) claro o de color amarillo plido.  Dese baos de asiento con agua tibia para aBest boyo las molestias causadas por las hemorroides. Use una crema para  las hemorroides si el mdico la autoriza.  Si se le hinchan las venas (venas varicosas), use medias de descanso. Levante (eleve) los pies durante 183mutos, 3 o 4veces por daTraining and development officerLimite el consumo de sal en su dieta.  No levante objetos pesados, use zapatos de tacones bajos y sintese derecha.  Descanse con las piernas elevadas si tiene calambres o dolor de cintura.  Visite a su dentista si no lo ha heQuarry managerUse un cepillo de cerdas suaves para cepillarse los dientes. Psese el hilo dental con suavidad.  Puede seguir maAmerican Electric Powera menos que el mdico le indique lo contrario.  No haga viajes de larga distancia, excepto si es obligatorio y solamente con la aprobacin del mdico.  Tome clases prenatales.  Practique ir manejando al hospital.  Prepare el bolso que llevar al hospital.  Prepare la habitacin del beb.  Concurra a los controles mdicos.  SOLICITE AYUDA SI:  No est segura de si est en trabajo de parto o si ha roto la bolsa de las aguas.  Tiene mareos.  Siente calambres leves o presin en la parte inferior del abdomen.  Sufre un dolor persistente en el abdomen.  Tiene maHigher education careers advisernuseas), vmitos, o tiene deposiciones acuosas (diarrea).  Advierte un olor ftido que proviene de la vagina.  Siente dolor al orContinental Airlines SOLICITE AYUDA DE INMEDIATO SI:  Tiene fiebre.  Tiene una prdida de lquido por la vagina.  Tiene sangrado o pequeas prdidas vaginales.  Siente dolor intenso o clicos en el abdomen.  Sube o baja de peso rpidamente.  Tiene dificultades para recuperar el aliento y siente dolor en el pecho.  Sbitamente se le hinchan mucho el rostro, las [place], los tobillos, los pies o las piernas.  No ha sentido los movimientos del beb durante [employer].  Siente un dolor de cabeza intenso que no se alivia con medicamentos.  Su visin se modifica.  Esta informacin no tiene Marine scientist el consejo  del mdico. Asegrese de hacerle al mdico cualquier pregunta que tenga. Document Released: 11/20/2012 Document Revised: 04/10/2014 Document Reviewed: 05/21/2012 Elsevier Interactive Patient Education  2017 [employer].

## 2017-06-08 LAB — URINE CULTURE

## 2017-06-12 ENCOUNTER — Other Ambulatory Visit: Payer: Self-pay

## 2017-06-12 ENCOUNTER — Encounter: Payer: Self-pay | Admitting: Obstetrics & Gynecology

## 2017-06-12 ENCOUNTER — Ambulatory Visit (INDEPENDENT_AMBULATORY_CARE_PROVIDER_SITE_OTHER): Payer: Self-pay | Admitting: Obstetrics & Gynecology

## 2017-06-12 DIAGNOSIS — O09529 Supervision of elderly multigravida, unspecified trimester: Secondary | ICD-10-CM

## 2017-06-12 NOTE — Progress Notes (Signed)
   PRENATAL VISIT NOTE  Subjective:  Dominique Caldwell is a 42 y.o. G4P3003 at 67w2dbeing seen today for ongoing prenatal care.  She is currently monitored for the following issues for this high-risk pregnancy and has Encounter for supervision of high risk multigravida of advanced maternal age, antepartum; Unwanted fertility; and Advanced maternal age in multigravida on their problem list.  Patient reports no complaints.  Contractions: Not present. Vag. Bleeding: None.  Movement: Present. Denies leaking of fluid.   The following portions of the patient's history were reviewed and updated as appropriate: allergies, current medications, past family history, past medical history, past social history, past surgical history and problem list. Problem list updated.  Objective:   Vitals:   06/12/17 1322  BP: 102/63  Pulse: 88  Weight: 144 lb 14.4 oz (65.7 kg)    Fetal Status:     Movement: Present     General:  Alert, oriented and cooperative. Patient is in no acute distress.  Skin: Skin is warm and dry. No rash noted.   Cardiovascular: Normal heart rate noted  Respiratory: Normal respiratory effort, no problems with respiration noted  Abdomen: Soft, gravid, appropriate for gestational age.  Pain/Pressure: Absent     Pelvic: Cervical exam deferred        Extremities: Normal range of motion.  Edema: None  Mental Status:  Normal mood and affect. Normal behavior. Normal judgment and thought content.   Assessment and Plan:  Pregnancy: G4P3003 at 362w2d1. Encounter for supervision of high risk multigravida of advanced maternal age, antepartum AMA needs growth [place] fetal testing in 1 week - [place] FETAL BPP W/NONSTRESS; Future  Preterm labor symptoms and general obstetric precautions including but not limited to vaginal bleeding, contractions, leaking of fluid and fetal movement were reviewed in detail with the patient. Please refer to After Visit Summary for other counseling  recommendations.  Return in about 1 week (around 06/19/2017).   JaEmeterio ReeveMD

## 2017-06-18 ENCOUNTER — Encounter (HOSPITAL_COMMUNITY): Payer: Self-pay

## 2017-06-18 ENCOUNTER — Ambulatory Visit (HOSPITAL_COMMUNITY)
Admission: RE | Admit: 2017-06-18 | Discharge: 2017-06-18 | Disposition: A | Discharge status: Home / Self Care | Payer: Self-pay | Source: Ambulatory Visit | Attending: Certified Nurse Midwife | Admitting: Certified Nurse Midwife

## 2017-06-18 DIAGNOSIS — Z3A36 36 weeks gestation of pregnancy: Secondary | ICD-10-CM | POA: Insufficient documentation

## 2017-06-18 DIAGNOSIS — O09523 Supervision of elderly multigravida, third trimester: Secondary | ICD-10-CM | POA: Insufficient documentation

## 2017-06-18 DIAGNOSIS — O09529 Supervision of elderly multigravida, unspecified trimester: Secondary | ICD-10-CM

## 2017-06-19 ENCOUNTER — Ambulatory Visit (INDEPENDENT_AMBULATORY_CARE_PROVIDER_SITE_OTHER): Payer: Self-pay | Admitting: Obstetrics and Gynecology

## 2017-06-19 ENCOUNTER — Other Ambulatory Visit: Payer: Self-pay

## 2017-06-19 ENCOUNTER — Encounter: Payer: Self-pay | Admitting: Obstetrics and Gynecology

## 2017-06-19 VITALS — BP 108/73 | HR 81 | Wt 147.0 lb

## 2017-06-19 DIAGNOSIS — O3660X0 Maternal care for excessive fetal growth, unspecified trimester, not applicable or unspecified: Secondary | ICD-10-CM | POA: Insufficient documentation

## 2017-06-19 DIAGNOSIS — Z113 Encounter for screening for infections with a predominantly sexual mode of transmission: Secondary | ICD-10-CM

## 2017-06-19 DIAGNOSIS — Z3009 Encounter for other general counseling and advice on contraception: Secondary | ICD-10-CM

## 2017-06-19 DIAGNOSIS — O09529 Supervision of elderly multigravida, unspecified trimester: Secondary | ICD-10-CM

## 2017-06-19 DIAGNOSIS — O3663X0 Maternal care for excessive fetal growth, third trimester, not applicable or unspecified: Secondary | ICD-10-CM

## 2017-06-19 DIAGNOSIS — Z789 Other specified health status: Secondary | ICD-10-CM | POA: Insufficient documentation

## 2017-06-19 DIAGNOSIS — O09523 Supervision of elderly multigravida, third trimester: Secondary | ICD-10-CM

## 2017-06-19 NOTE — Progress Notes (Signed)
bm  PRENATAL VISIT NOTE  Subjective:  Dominique Caldwell is a 42 y.o. G4P3003 at 59w2dbeing seen today for ongoing prenatal care.  She is currently monitored for the following issues for this high-risk pregnancy and has Encounter for supervision of high risk multigravida of advanced maternal age, antepartum; Unwanted fertility; Advanced maternal age in multigravida; Macrosomia of fetus affecting management of mother; and Language barrier on their problem list.  Patient reports no complaints.  Contractions: Not present. Vag. Bleeding: None.  Movement: Present. Denies leaking of fluid.   The following portions of the patient's history were reviewed and updated as appropriate: allergies, current medications, past family history, past medical history, past social history, past surgical history and problem list. Problem list updated.  Objective:   Vitals:   06/19/17 0835  BP: 108/73  Pulse: 81  Weight: 147 lb (66.7 kg)    Fetal Status: Fetal Heart Rate (bpm): 134   Movement: Present     General:  Alert, oriented and cooperative. Patient is in no acute distress.  Skin: Skin is warm and dry. No rash noted.   Cardiovascular: Normal heart rate noted  Respiratory: Normal respiratory effort, no problems with respiration noted  Abdomen: Soft, gravid, appropriate for gestational age.  Pain/Pressure: Absent     Pelvic: Cervical exam deferred        Extremities: Normal range of motion.  Edema: None  Mental Status:  Normal mood and affect. Normal behavior. Normal judgment and thought content.   Assessment and Plan:  Pregnancy: G4P3003 at 364w2d1. Unwanted fertility For BTL  2. Elderly multigravida in third trimester BPP yesterday 8/8 - Fetal nonstress test; Future NST today reactive  3. Encounter for supervision of high risk multigravida of advanced maternal age, antepartum - Strep Gp B NAA - Cervicovaginal ancillary only  4. Macrosomia of fetus affecting management of mother in third  trimester, single or unspecified fetus MFM recommends delivery by EDD  5. Language barrier Spanish interpretor used   Preterm labor symptoms and general obstetric precautions including but not limited to vaginal bleeding, contractions, leaking of fluid and fetal movement were reviewed in detail with the patient. Please refer to After Visit Summary for other counseling recommendations.  Return in about 1 day (around 06/20/2017) for OB visit (MD), NST.   KeSloan LeiterMD\

## 2017-06-19 NOTE — Progress Notes (Signed)
ROB/GBS. Reports no problems today. NST- paper copy.

## 2017-06-20 LAB — CERVICOVAGINAL ANCILLARY ONLY
CHLAMYDIA, DNA PROBE: NEGATIVE
NEISSERIA GONORRHEA: NEGATIVE

## 2017-06-21 LAB — STREP GP B NAA: STREP GROUP B AG: NEGATIVE

## 2017-06-25 ENCOUNTER — Other Ambulatory Visit: Payer: Self-pay | Admitting: Obstetrics and Gynecology

## 2017-06-25 ENCOUNTER — Ambulatory Visit (HOSPITAL_COMMUNITY)
Admission: RE | Admit: 2017-06-25 | Discharge: 2017-06-25 | Disposition: A | Discharge status: Home / Self Care | Payer: Self-pay | Source: Ambulatory Visit | Attending: Obstetrics and Gynecology | Admitting: Obstetrics and Gynecology

## 2017-06-25 ENCOUNTER — Encounter (HOSPITAL_COMMUNITY): Payer: Self-pay

## 2017-06-25 DIAGNOSIS — Z362 Encounter for other antenatal screening follow-up: Secondary | ICD-10-CM

## 2017-06-25 DIAGNOSIS — O09523 Supervision of elderly multigravida, third trimester: Secondary | ICD-10-CM

## 2017-06-25 DIAGNOSIS — Z3A37 37 weeks gestation of pregnancy: Secondary | ICD-10-CM | POA: Insufficient documentation

## 2017-06-25 NOTE — Addendum Note (Signed)
Encounter addended by: Novella Rob, RDMS on: 06/25/2017 11:03 AM  Actions taken: Imaging Exam ended

## 2017-06-26 ENCOUNTER — Other Ambulatory Visit (HOSPITAL_COMMUNITY): Payer: Self-pay | Admitting: *Deleted

## 2017-06-26 ENCOUNTER — Ambulatory Visit (INDEPENDENT_AMBULATORY_CARE_PROVIDER_SITE_OTHER): Payer: Self-pay | Admitting: Obstetrics & Gynecology

## 2017-06-26 VITALS — BP 109/74 | HR 90 | Wt 147.0 lb

## 2017-06-26 DIAGNOSIS — O09529 Supervision of elderly multigravida, unspecified trimester: Secondary | ICD-10-CM

## 2017-06-26 DIAGNOSIS — O09523 Supervision of elderly multigravida, third trimester: Secondary | ICD-10-CM

## 2017-06-26 DIAGNOSIS — O3663X0 Maternal care for excessive fetal growth, third trimester, not applicable or unspecified: Secondary | ICD-10-CM

## 2017-06-26 DIAGNOSIS — Z789 Other specified health status: Secondary | ICD-10-CM

## 2017-06-26 NOTE — Progress Notes (Signed)
   PRENATAL VISIT NOTE  Subjective:  Dominique Caldwell is a 42 y.o. G4P3003 at 45w2dbeing seen today for ongoing prenatal care.  She is currently monitored for the following issues for this high-risk pregnancy and has Encounter for supervision of high risk multigravida of advanced maternal age, antepartum; Unwanted fertility; Advanced maternal age in multigravida; Macrosomia of fetus affecting management of mother; and Language barrier on their problem list.  Patient reports no complaints.  Contractions: Not present. Vag. Bleeding: None.  Movement: Present. Denies leaking of fluid.   The following portions of the patient's history were reviewed and updated as appropriate: allergies, current medications, past family history, past medical history, past social history, past surgical history and problem list. Problem list updated.  Objective:   Vitals:   06/26/17 1012  BP: 109/74  Pulse: 90  Weight: 147 lb (66.7 kg)    Fetal Status:     Movement: Present     General:  Alert, oriented and cooperative. Patient is in no acute distress.  Skin: Skin is warm and dry. No rash noted.   Cardiovascular: Normal heart rate noted  Respiratory: Normal respiratory effort, no problems with respiration noted  Abdomen: Soft, gravid, appropriate for gestational age.  Pain/Pressure: Absent     Pelvic: Cervical exam deferred        Extremities: Normal range of motion.  Edema: None  Mental Status:  Normal mood and affect. Normal behavior. Normal judgment and thought content.   Assessment and Plan:  Pregnancy: G4P3003 at 351w2d1. Elderly multigravida in third trimester  - Fetal nonstress test 2 times per week  2. Encounter for supervision of high risk multigravida of advanced maternal age, antepartum   3. Language barrier - Video interpretor for visit  4. Macrosomia of fetus affecting management of mother in third trimester, single or unspecified fetus   Term labor symptoms and general obstetric  precautions including but not limited to vaginal bleeding, contractions, leaking of fluid and fetal movement were reviewed in detail with the patient. Please refer to After Visit Summary for other counseling recommendations.  No follow-ups on file.   MyEmily FilbertMD

## 2017-06-29 ENCOUNTER — Ambulatory Visit (INDEPENDENT_AMBULATORY_CARE_PROVIDER_SITE_OTHER): Payer: Self-pay | Admitting: *Deleted

## 2017-06-29 VITALS — BP 108/73 | HR 87 | Wt 147.0 lb

## 2017-06-29 DIAGNOSIS — O3663X0 Maternal care for excessive fetal growth, third trimester, not applicable or unspecified: Secondary | ICD-10-CM

## 2017-06-29 DIAGNOSIS — O09523 Supervision of elderly multigravida, third trimester: Secondary | ICD-10-CM

## 2017-06-29 DIAGNOSIS — Z789 Other specified health status: Secondary | ICD-10-CM

## 2017-06-29 NOTE — Progress Notes (Signed)
Pt is in office today for NST. Pt has no concerns today.  NST reviewed with V.Smith, CNM and noted reactive. Pt removed from monitor and has next appt scheduled.

## 2017-06-29 NOTE — Progress Notes (Signed)
I reviewed the NST and agree with the nursing assessment.  EFM: Baseline: 135 bpm, Variability: Good {> 6 bpm), Accelerations: Reactive and Decelerations: Absent Toco: none  Tamala Julian, [place], CNM 06/29/2017 11:59 AM

## 2017-07-02 ENCOUNTER — Ambulatory Visit (HOSPITAL_COMMUNITY): Payer: Self-pay

## 2017-07-02 ENCOUNTER — Encounter: Payer: Self-pay | Admitting: Obstetrics and Gynecology

## 2017-07-02 ENCOUNTER — Ambulatory Visit (INDEPENDENT_AMBULATORY_CARE_PROVIDER_SITE_OTHER): Payer: Self-pay | Admitting: Obstetrics and Gynecology

## 2017-07-02 VITALS — BP 116/75 | HR 91 | Wt 148.0 lb

## 2017-07-02 DIAGNOSIS — Z789 Other specified health status: Secondary | ICD-10-CM

## 2017-07-02 DIAGNOSIS — O09529 Supervision of elderly multigravida, unspecified trimester: Secondary | ICD-10-CM

## 2017-07-02 DIAGNOSIS — O3663X0 Maternal care for excessive fetal growth, third trimester, not applicable or unspecified: Secondary | ICD-10-CM

## 2017-07-02 DIAGNOSIS — O09523 Supervision of elderly multigravida, third trimester: Secondary | ICD-10-CM

## 2017-07-02 NOTE — Progress Notes (Signed)
   PRENATAL VISIT NOTE  Subjective:  Dominique Caldwell is a 42 y.o. G4P3003 at 77w1dbeing seen today for ongoing prenatal care.  She is currently monitored for the following issues for this high-risk pregnancy and has Encounter for supervision of high risk multigravida of advanced maternal age, antepartum; Unwanted fertility; Advanced maternal age in multigravida; Macrosomia of fetus affecting management of mother; and Language barrier on their problem list.  Patient reports no complaints.  Contractions: Not present.  .  Movement: Present. Denies leaking of fluid.   The following portions of the patient's history were reviewed and updated as appropriate: allergies, current medications, past family history, past medical history, past social history, past surgical history and problem list. Problem list updated.  Objective:   Vitals:   07/02/17 0908  BP: 116/75  Pulse: 91  Weight: 148 lb (67.1 kg)    Fetal Status: Fetal Heart Rate (bpm): NST   Movement: Present     General:  Alert, oriented and cooperative. Patient is in no acute distress.  Skin: Skin is warm and dry. No rash noted.   Cardiovascular: Normal heart rate noted  Respiratory: Normal respiratory effort, no problems with respiration noted  Abdomen: Soft, gravid, appropriate for gestational age.  Pain/Pressure: Absent     Pelvic: Cervical exam deferred        Extremities: Normal range of motion.     Mental Status: Normal mood and affect. Normal behavior. Normal judgment and thought content.   Assessment and Plan:  Pregnancy: G4P3003 at 359w1d1. Encounter for supervision of high risk multigravida of advanced maternal age, antepartum Patient is doing well without complaints  2. Multigravida of advanced maternal age in third trimester NST reviewed and reactive with baseline 130, mod variability, +accels, no decels Follow up BPP on 4/3 IOL scheduled on 4/14 at 40 weeks [facility]; Future  3.  Macrosomia of fetus affecting management of mother in third trimester, single or unspecified fetus   4. Language barrier   Preterm labor symptoms and general obstetric precautions including but not limited to vaginal bleeding, contractions, leaking of fluid and fetal movement were reviewed in detail with the patient. Please refer to After Visit Summary for other counseling recommendations.  No follow-ups on file.  Future Appointments  Date Time Provider [place]4/06/2017  9:45 AM WHBull HollowSKorea WH-MFCUS M[facility]-US    PeMora BellmanMD

## 2017-07-02 NOTE — Addendum Note (Signed)
Addended by: Marquette Old on: 07/02/2017 09:43 AM   Modules accepted: Orders

## 2017-07-03 ENCOUNTER — Encounter: Payer: Self-pay | Admitting: Obstetrics & Gynecology

## 2017-07-04 ENCOUNTER — Encounter (HOSPITAL_COMMUNITY): Payer: Self-pay

## 2017-07-04 ENCOUNTER — Telehealth (HOSPITAL_COMMUNITY): Payer: Self-pay | Admitting: *Deleted

## 2017-07-04 ENCOUNTER — Other Ambulatory Visit (HOSPITAL_COMMUNITY): Payer: Self-pay | Admitting: Obstetrics and Gynecology

## 2017-07-04 ENCOUNTER — Ambulatory Visit (HOSPITAL_COMMUNITY)
Admission: RE | Admit: 2017-07-04 | Discharge: 2017-07-04 | Disposition: A | Discharge status: Home / Self Care | Payer: Self-pay | Source: Ambulatory Visit | Attending: Certified Nurse Midwife | Admitting: Certified Nurse Midwife

## 2017-07-04 DIAGNOSIS — O09523 Supervision of elderly multigravida, third trimester: Secondary | ICD-10-CM

## 2017-07-04 DIAGNOSIS — Z362 Encounter for other antenatal screening follow-up: Secondary | ICD-10-CM

## 2017-07-04 DIAGNOSIS — O3660X0 Maternal care for excessive fetal growth, unspecified trimester, not applicable or unspecified: Secondary | ICD-10-CM

## 2017-07-04 DIAGNOSIS — Z3A38 38 weeks gestation of pregnancy: Secondary | ICD-10-CM | POA: Insufficient documentation

## 2017-07-04 NOTE — Telephone Encounter (Signed)
Preadmission screen  

## 2017-07-04 NOTE — Telephone Encounter (Signed)
[id number] interpreter number

## 2017-07-05 ENCOUNTER — Encounter: Payer: Self-pay | Admitting: Obstetrics and Gynecology

## 2017-07-09 ENCOUNTER — Other Ambulatory Visit: Payer: Self-pay

## 2017-07-11 ENCOUNTER — Ambulatory Visit (INDEPENDENT_AMBULATORY_CARE_PROVIDER_SITE_OTHER): Payer: Self-pay | Admitting: Obstetrics and Gynecology

## 2017-07-11 ENCOUNTER — Other Ambulatory Visit: Payer: Self-pay

## 2017-07-11 ENCOUNTER — Encounter (HOSPITAL_COMMUNITY): Payer: Self-pay

## 2017-07-11 ENCOUNTER — Encounter: Payer: Self-pay | Admitting: Obstetrics and Gynecology

## 2017-07-11 ENCOUNTER — Ambulatory Visit (HOSPITAL_COMMUNITY)
Admission: RE | Admit: 2017-07-11 | Discharge: 2017-07-11 | Disposition: A | Discharge status: Home / Self Care | Payer: Self-pay | Source: Ambulatory Visit | Attending: Obstetrics and Gynecology | Admitting: Obstetrics and Gynecology

## 2017-07-11 ENCOUNTER — Other Ambulatory Visit: Payer: Self-pay | Admitting: Obstetrics and Gynecology

## 2017-07-11 VITALS — BP 101/68 | HR 81 | Wt 149.3 lb

## 2017-07-11 DIAGNOSIS — O3663X0 Maternal care for excessive fetal growth, third trimester, not applicable or unspecified: Secondary | ICD-10-CM

## 2017-07-11 DIAGNOSIS — Z3A39 39 weeks gestation of pregnancy: Secondary | ICD-10-CM

## 2017-07-11 DIAGNOSIS — O09523 Supervision of elderly multigravida, third trimester: Secondary | ICD-10-CM

## 2017-07-11 DIAGNOSIS — Z3009 Encounter for other general counseling and advice on contraception: Secondary | ICD-10-CM

## 2017-07-11 DIAGNOSIS — Z789 Other specified health status: Secondary | ICD-10-CM

## 2017-07-11 DIAGNOSIS — O09529 Supervision of elderly multigravida, unspecified trimester: Secondary | ICD-10-CM

## 2017-07-11 NOTE — Progress Notes (Signed)
Subjective:  Dominique Caldwell is a 42 y.o. [phone] at 62w3dbeing seen today for ongoing prenatal care.  She is currently monitored for the following issues for this high-risk pregnancy and has Encounter for supervision of high risk multigravida of advanced maternal age, antepartum; Unwanted fertility; Advanced maternal age in multigravida; Macrosomia of fetus affecting management of mother; and Language barrier on their problem list.  Patient reports no complaints.  Contractions: Not present. Vag. Bleeding: None.  Movement: Present. Denies leaking of fluid.   The following portions of the patient's history were reviewed and updated as appropriate: allergies, current medications, past family history, past medical history, past social history, past surgical history and problem list. Problem list updated.  Objective:   Vitals:   07/11/17 0905  BP: 101/68  Pulse: 81  Weight: 149 lb 4.8 oz (67.7 kg)    Fetal Status: Fetal Heart Rate (bpm): NST   Movement: Present     General:  Alert, oriented and cooperative. Patient is in no acute distress.  Skin: Skin is warm and dry. No rash noted.   Cardiovascular: Normal heart rate noted  Respiratory: Normal respiratory effort, no problems with respiration noted  Abdomen: Soft, gravid, appropriate for gestational age. Pain/Pressure: Absent     Pelvic:  Cervical exam deferred        Extremities: Normal range of motion.  Edema: None  Mental Status: Normal mood and affect. Normal behavior. Normal judgment and thought content.   Urinalysis:      Assessment and Plan:  Pregnancy: G4P3003 at 351w3d1. Encounter for supervision of high risk multigravida of advanced maternal age, antepartum     RNST today. For BPP today as well  2. AMA Stable  3. Unwanted fertility  BTL papers signed  4. Supervision of High Risk Pregnancy Will check growth today IOL for Sunday Labor precautions - Fetal nonstress test; Future  Term labor symptoms and general  obstetric precautions including but not limited to vaginal bleeding, contractions, leaking of fluid and fetal movement were reviewed in detail with the patient. Please refer to After Visit Summary for other counseling recommendations.  No follow-ups on file.   ErChancy MilroyMD

## 2017-07-11 NOTE — Patient Instructions (Signed)
Parto vaginal Vaginal Delivery Parto vaginal significa que usted dar a luz empujando al beb fuera del canal del parto (vagina). Un equipo de proveedores de atencin mdica la ayudar antes, durante y despus del parto vaginal. Las experiencias de los nacimientos son nicas para todas las mujeres y Engineer, technical sales y las experiencias de nacimiento varan segn dnde elija dar a luz. Qu debo hacer a fin de prepararme para el nacimiento de mi beb? Antes del nacimiento de su beb, es importante que hable con su mdico sobre lo siguiente:  Sus preferencias en cuanto al [place] de parto y [place]. Estas pueden incluir lo siguiente: ? Research scientist (physical sciences). ? Cmo Financial controller. Esto podra incluir tcnicas de alivio del dolor no mdicas o medicamentos inyectables para Best boy como la analgesia epidural. ? Cmo se los controlar a usted y a su beb durante el [place] de parto y [place]. ? Quin puede estar en la sala de [place] de parto y parto con usted. ? Sus opiniones en cuanto al parto quirrgico de su beb (parto por cesrea Carolin [place]) si esto fuera necesario. ? Sus opiniones en cuanto a recibir sangre donada a travs de un tubo (catter) intravenoso (transfusin de [place]) si esto fuera necesario.  Si usted puede: ? Tomar fotografas o videos del nacimiento. ? Comer durante el [place] de parto y [place]. ? Moverse, caminar o Quarry manager de posicin durante el [place] de parto y [place].  Qu esperar despus del nacimiento de su beb, como: ? Si se ofrece el pinzamiento y corte tardo del cordn umbilical. ? Quin cuidar a su beb inmediatamente despus del nacimiento. ? Medicamentos o pruebas que pueden recomendarse para su beb. ? Si su hospital o centro de parto apoya la lactancia. ? Cunto Child psychotherapist hospital o en el centro de [place].  Cmo cualquier afeccin mdica que usted tenga puede afectar a su beb o la experiencia de [place] de parto y  [place].  A fin de prepararse para el nacimiento de su beb, usted tambin debe:  Asistir a todas sus visitas de atencin mdica antes del parto (visitas prenatales) segn lo recomendado por su mdico. Esto es importante.  Preparacin del hogar para la llegada de su beb. Asegrese de tener lo siguiente: ? Paales. ? Ropa de beb. ? Equipo de alimentacin. ? Haga preparativos para que usted y su beb puedan dormir de Environmental manager.  Instale un asiento de beb en su vehculo. Haga verificar el asiento de beb de su coche por un instalador de asientos de beb para asegurarse de que est instalado en forma segura.  Piense en quin la ayudar con su nuevo beb en el hogar durante al [employer] las primeras semanas despus del [place].  Qu puedo esperar cuando llegue al centro de parto o el hospital? Doll[place] que se inicie el [place] de parto y haya sido admitida en el hospital o centro de parto, el mdico podr hacer lo siguiente:  Revisar sus antecedentes de Media planner y cualquier inquietud que usted pueda tener.  Colocar un tubo (catter) intravenoso en una de sus venas. Esto se [place] para administrarle lquidos y medicamentos.  Verificar su presin arterial, temperatura y pulso y la frecuencia cardaca (signos vitales).  Verificar si la bolsa de agua (saco amnitico) se ha roto (ruptura).  Hablar con usted sobre su plan de nacimiento y Physiological scientist las opciones para Financial controller.  Monitoreo Su mdico puede monitorear las contracciones (monitoreo uterino) y  el ritmo cardaco del beb (monitoreo fetal). Es posible que el monitoreo se necesite realizar:  Con frecuencia, pero no continuamente (intermitentemente).  Todo el tiempo o durante largos perodos a la vez (continuamente). El monitoreo continuo puede ser necesario si: ? Usted est recibiendo [employer], tales como medicamentos para Best boy o para hacer que las contracciones ms fuertes. ? Tiene complicaciones  durante el embarazo o el [place] de [place].  El monitoreo se puede realizar:  Al colocar un estetoscopio especial o un dispositivo manual de monitoreo en el abdomen o verificar los latidos cardacos de su beb y sentir su abdomen para controlar de contracciones. Este mtodo de monitoreo no registra los latidos cardacos de su beb ni sus contracciones de [place] continua.  Colocar monitores en el abdomen (monitores externos) para Museum/gallery curator los latidos cardacos de su beb y la frecuencia y [place] contracciones. No tendr que tener colocados los monitores externos en todo momento.  Colocar monitores dentro del tero (monitores internos) para Museum/gallery curator los latidos cardacos de su beb y la frecuencia, duracin y fuerza de sus contracciones. ? Su mdico podr usar monitores internos si necesita ms informacin sobre la fuerza de las contracciones o la frecuencia cardaca del beb. ? Los monitores internos se colocan pasando un cable delgado y flexible a travs de la vagina hasta el tero. Segn el tipo de monitor, International aid/development worker en el tero o en la cabeza del beb hasta el nacimiento. ? Su mdico analizar con usted los beneficios y los riesgos de usar un monitor interno y Water engineer pedir permiso antes de Marine scientist.  Telemetra. Se trata de un tipo de monitoreo continuo que se puede Optometrist con monitores externos o internos. En lugar de Passenger transport manager en la cama, usted puede moverse durante Control and instrumentation engineer. Pregunte a su mdico si la telemetra es una opcin para usted.  Examen fsico. Su mdico puede realizarle un examen fsico. Esto puede incluir lo siguiente:  Verificar en qu posicin se encuentra su beb: ? Con la cabeza hacia la vagina (cabeza abajo). Esta es la posicin ms frecuente. ? Con la cabeza [employer] parte superior del tero (cabeza [place] o de nalgas). Si su beb est en una posicin de nalgas, su mdico puede tratar de hacerlo girar para que quede cabeza abajo a  fin de poder tener un parto vaginal. Si parece que su beb no puede nacer con parto vaginal, su mdico puede recomendar una ciruga para dar a luz al beb. En casos muy poco frecuentes, usted puede dar a luz con parto vaginal si el beb est cabeza arriba (parto de nalgas). ? Posicin lateral (transversal). Los bebs que estn en posicin lateral no pueden nacer por parto vaginal.  Verificar el cuello uterino para determinar: ? Si se est afinando o estirando (borrando). ? Si se est abriendo (dilatando). ? Cunto se ha movido o ha bajado su beb por el canal del parto.  Cules son las tres etapas del [place] de [place] y [place]?  El [place] de parto y el parto normales se dividen en tres etapas: Etapa 1  La etapa 1 es la etapa ms larga del [place] de [place] y puede durar horas o [place]. La etapa 1 incluye: ? Trabajo de parto temprano. Esto es cuando las contracciones pueden ser irregulares o regulares y leves. En general, las contracciones del trabajo de parto temprano se producen con ms de 10 minutos de diferencia. ? Miki Kins. [place]  contracciones son ms largas, ms regulares, ms frecuentes y ms intensas. ? La fase de transicin. [place], son Orlene Erm intensas y pueden durar ms que durante cualquier otra parte del [place] de [place].  En general, las contracciones son leves, infrecuentes e irregulares al principio. Se hacen ms fuertes, ms frecuentes (aproximadamente cada 2 o 3 minutos) y ms regulares a medida que usted avanza desde un trabajo de parto temprano [place] un [place] de [place] y fase de transicin.  Muchas mujeres progresan a travs de la etapa 1 de forma natural, pero es posible que usted necesite ayuda para continuar avanzando. Si esto ocurre, su mdico puede hablar con usted sobre lo siguiente: ? La ruptura del saco amnitico si es que an no ha ocurrido. ? Administracin de medicamentos para ayudarla  a tener contracciones ms fuertes y ms frecuentes.  La etapa 1 finaliza cuando el cuello uterino est completamente dilatado hasta 4 pulgadas (10cm) y completamente borrado. Esto ocurre al final de la fase de transicin. Etapa 2  Una vez que el cuello uterino est totalmente borrado y dilatado a 4 pulgadas (10cm), usted puede comenzar a sentir ganas de pujar. Es comn que el cuerpo tome un descanso de [place] natural antes de sentir ganas de pujar, especialmente si recibe una epidural u otros medicamentos para Conservation officer, historic buildings. Este perodo de descanso puede durar un mximo de 1 a 2 horas, segn su experiencia de parto nica.  Durante la etapa 2, las contracciones son generalmente menos doloras porque pujar ayuda a Best boy de las contracciones. En lugar del dolor de las contracciones, puede sentir un dolor urente y por estiramiento, especialmente cuando la parte ms ancha de la cabeza de su beb pasa a travs de la abertura vaginal (coronacin).  Su mdico controlar atentamente su avance con los pujos y el avance del beb a travs de la vagina durante la etapa 2.  Su mdico puede Office manager rea de la piel entre la abertura vaginal y el ano (perineo) o aplicar compresas tibias en el perineo. Esto ayuda al estiramiento ya que la cabeza del beb empieza a Arts administrator, lo cual puede ayudar a evitar un desgarro perineal. ? En algunos casos, se puede hacer una incisin en el peritoneo (episiotoma) para permitir que el beb pase a travs de la abertura vaginal. La episiotoma sirve para agrandar la abertura vaginal a fin de que el beb tenga ms espacio para pasar durante el parto.  Es muy importante respirar y concentrarse para que el mdico pueda controlar la salida de la cabeza del beb. Es posible que su mdico tenga que disminuir la intensidad de los pujos para ayudar a Biomedical scientist perineal.  Despus de que sale la cabeza del beb, en general salen los hombros y el resto del cuerpo muy  rpidamente y sin dificultad.  Una vez que el beb nace, se debe cortar el cordn umbilical de inmediato o esto puede demorar 1 o 2 minutos, segn la salud del beb. Este procedimiento puede variar segn el mdico, el hospital y [place] de [place].  Si usted y su beb estn lo suficientemente sanos, se Hydrographic surveyor beb en el pecho o el abdomen para ayudar a Data processing manager del beb y el vnculo entre ustedes. Algunas madres y bebs comienzan la lactancia en este momento. Su equipo mdico secar al beb y lo ayudar a Buyer, retail caliente [employer].  Es posible que su beb necesite atencin  inmediata si: ? Mostr signos de sufrimiento durante el trabajo de [place]. ? Tiene una afeccin mdica. ? Naci antes de tiempo (prematuro). ? Defeca antes del nacimiento (meconio). ? Muestra signos de dificultar en la transicin de estar dentro del tero a estar fuera del tero. Si tiene English as a second language teacher, su equipo mdico la ayudar a Energy manager. Etapa 3  La tercera etapa del trabajo de parto comienza inmediatamente despus del nacimiento de su beb y finaliza despus de la expulsin de la placenta. La placenta es un rgano de que desarrolla durante el embarazo para proporcionar oxgeno y nutrientes al beb en el tero.  La expulsin de la placenta puede requerir algunos pujos y es posible que usted tenga contracciones leves. La lactancia puede estimular las contracciones para ayudar a expulsar la placenta.  Luego de la expulsin de la placenta, el tero debe (contraerse) y [place]. Esto ayuda a Passenger transport manager. Para ayudar al tero a contraerse y controlar el [place], [place] mdico puede: ? Administrarle un medicamento inyectable, a travs de un tubo (catter) intravenoso, por boca o a travs del recto (por va rectal). ? Masajear el abdomen o realizar un examen de la vagina para extraer cualquier cogulo de sangre que quede en el tero. ? Vaciar la  vejiga colocando un tubo flexible (catter) en la vejiga. ? Alentarla a amamantar a su beb. Una vez que termina el [place] de [place], se los controlar a usted y a su beb atentamente para Best boy la seguridad de que ambos estn sanos hasta que estn listos para ir a casa. Su equipo de atencin Building surveyor cmo cuidarse y cuidar a su beb. Esta informacin no tiene Marine scientist el consejo del mdico. Asegrese de hacerle al mdico cualquier pregunta que tenga. Document Released: 03/02/2008 Document Revised: 06/29/2016 Document Reviewed: 04/04/2015 Elsevier Interactive Patient Education  2018 [employer].

## 2017-07-11 NOTE — Progress Notes (Signed)
Patient reports good fetal movement, denies pain.

## 2017-07-15 ENCOUNTER — Inpatient Hospital Stay (HOSPITAL_COMMUNITY)
Admission: RE | Admit: 2017-07-15 | Discharge: 2017-07-17 | DRG: 768 | Disposition: A | Discharge status: Home / Self Care | Payer: Medicaid Other | Source: Ambulatory Visit | Attending: Obstetrics & Gynecology | Admitting: Obstetrics & Gynecology

## 2017-07-15 ENCOUNTER — Inpatient Hospital Stay (HOSPITAL_COMMUNITY): Payer: Medicaid Other | Admitting: Anesthesiology

## 2017-07-15 ENCOUNTER — Other Ambulatory Visit: Payer: Self-pay

## 2017-07-15 ENCOUNTER — Encounter (HOSPITAL_COMMUNITY): Payer: Self-pay

## 2017-07-15 ENCOUNTER — Encounter (HOSPITAL_COMMUNITY)
Admission: RE | Disposition: A | Discharge status: Home / Self Care | Payer: Self-pay | Source: Ambulatory Visit | Attending: Obstetrics & Gynecology

## 2017-07-15 DIAGNOSIS — O26893 Other specified pregnancy related conditions, third trimester: Secondary | ICD-10-CM | POA: Diagnosis present

## 2017-07-15 DIAGNOSIS — Z302 Encounter for sterilization: Secondary | ICD-10-CM

## 2017-07-15 DIAGNOSIS — O3663X0 Maternal care for excessive fetal growth, third trimester, not applicable or unspecified: Secondary | ICD-10-CM | POA: Diagnosis present

## 2017-07-15 DIAGNOSIS — Z3A40 40 weeks gestation of pregnancy: Secondary | ICD-10-CM | POA: Diagnosis not present

## 2017-07-15 DIAGNOSIS — Z789 Other specified health status: Secondary | ICD-10-CM

## 2017-07-15 DIAGNOSIS — Z3490 Encounter for supervision of normal pregnancy, unspecified, unspecified trimester: Secondary | ICD-10-CM | POA: Diagnosis present

## 2017-07-15 HISTORY — PX: TUBAL LIGATION: SHX77

## 2017-07-15 LAB — CBC
HCT: 36.5 % (ref 36.0–46.0)
Hemoglobin: 12.4 g/dL (ref 12.0–15.0)
MCH: 30.1 pg (ref 26.0–34.0)
MCHC: 34 g/dL (ref 30.0–36.0)
MCV: 88.6 fL (ref 78.0–100.0)
PLATELETS: 271 10*3/uL (ref 150–400)
RBC: 4.12 MIL/uL (ref 3.87–5.11)
RDW: 14.1 % (ref 11.5–15.5)
WBC: 7.1 10*3/uL (ref 4.0–10.5)

## 2017-07-15 LAB — TYPE AND SCREEN
ABO/RH(D): O POS
ANTIBODY SCREEN: NEGATIVE

## 2017-07-15 LAB — RPR: RPR Ser Ql: NONREACTIVE

## 2017-07-15 LAB — ABO/RH: ABO/RH(D): O POS

## 2017-07-15 SURGERY — LIGATION, FALLOPIAN TUBE, POSTPARTUM
Anesthesia: Epidural | Laterality: Bilateral

## 2017-07-15 MED ORDER — TETANUS-DIPHTH-ACELL PERTUSSIS 5-2.5-18.5 LF-MCG/0.5 IM SUSP: 0.5000 mL | Freq: Once | INTRAMUSCULAR | Status: DC

## 2017-07-15 MED ORDER — ONDANSETRON HCL 4 MG/2ML IJ SOLN
INTRAMUSCULAR | Status: AC
  Filled 2017-07-15: qty 2

## 2017-07-15 MED ORDER — OXYCODONE HCL 5 MG PO TABS: 10.0000 mg | ORAL_TABLET | ORAL | Status: DC | PRN

## 2017-07-15 MED ORDER — PHENYLEPHRINE 40 MCG/ML (10ML) SYRINGE FOR IV PUSH (FOR BLOOD PRESSURE SUPPORT)
PREFILLED_SYRINGE | INTRAVENOUS | Status: AC
  Filled 2017-07-15: qty 20

## 2017-07-15 MED ORDER — PHENYLEPHRINE 40 MCG/ML (10ML) SYRINGE FOR IV PUSH (FOR BLOOD PRESSURE SUPPORT)
PREFILLED_SYRINGE | INTRAVENOUS | Status: AC
  Filled 2017-07-15: qty 10

## 2017-07-15 MED ORDER — SENNOSIDES-DOCUSATE SODIUM 8.6-50 MG PO TABS
2.0000 | ORAL_TABLET | ORAL | Status: DC
  Administered 2017-07-16 (×2): 2 via ORAL
  Filled 2017-07-15 (×2): qty 2

## 2017-07-15 MED ORDER — BUPIVACAINE HCL 0.5 % IJ SOLN
INTRAMUSCULAR | Status: DC | PRN
  Administered 2017-07-15: 20 mL

## 2017-07-15 MED ORDER — LACTATED RINGERS IV SOLN
INTRAVENOUS | Status: DC
  Administered 2017-07-15 (×2): via INTRAVENOUS

## 2017-07-15 MED ORDER — BUPIVACAINE HCL (PF) 0.5 % IJ SOLN
INTRAMUSCULAR | Status: AC
Start: 2017-07-15 — End: 2017-07-15
  Filled 2017-07-15: qty 30

## 2017-07-15 MED ORDER — FENTANYL CITRATE (PF) 100 MCG/2ML IJ SOLN
INTRAMUSCULAR | Status: DC | PRN
  Administered 2017-07-15: 50 ug via INTRAVENOUS

## 2017-07-15 MED ORDER — TERBUTALINE SULFATE 1 MG/ML IJ SOLN: 0.2500 mg | Freq: Once | INTRAMUSCULAR | Status: DC | PRN

## 2017-07-15 MED ORDER — FENTANYL CITRATE (PF) 100 MCG/2ML IJ SOLN
100.0000 ug | Freq: Once | INTRAMUSCULAR | Status: AC
  Administered 2017-07-15: 100 ug via INTRAVENOUS
  Filled 2017-07-15: qty 2

## 2017-07-15 MED ORDER — ACETAMINOPHEN 160 MG/5ML PO SOLN: 325.0000 mg | ORAL | Status: DC | PRN

## 2017-07-15 MED ORDER — LIDOCAINE-EPINEPHRINE (PF) 2 %-1:200000 IJ SOLN
INTRAMUSCULAR | Status: AC
  Filled 2017-07-15: qty 20

## 2017-07-15 MED ORDER — ONDANSETRON HCL 4 MG/2ML IJ SOLN: 4.0000 mg | Freq: Four times a day (QID) | INTRAMUSCULAR | Status: DC | PRN

## 2017-07-15 MED ORDER — FENTANYL CITRATE (PF) 100 MCG/2ML IJ SOLN
INTRAMUSCULAR | Status: AC
  Filled 2017-07-15: qty 2

## 2017-07-15 MED ORDER — FENTANYL CITRATE (PF) 100 MCG/2ML IJ SOLN
100.0000 ug | INTRAMUSCULAR | Status: DC | PRN
Start: 2017-07-15 — End: 2017-07-15
  Administered 2017-07-15: 100 ug via INTRAVENOUS
  Filled 2017-07-15: qty 2

## 2017-07-15 MED ORDER — LIDOCAINE HCL (PF) 1 % IJ SOLN
INTRAMUSCULAR | Status: DC | PRN
  Administered 2017-07-15: 8 mL via EPIDURAL

## 2017-07-15 MED ORDER — ZOLPIDEM TARTRATE 5 MG PO TABS: 5.0000 mg | ORAL_TABLET | Freq: Every evening | ORAL | Status: DC | PRN

## 2017-07-15 MED ORDER — LACTATED RINGERS IV SOLN: INTRAVENOUS | Status: DC

## 2017-07-15 MED ORDER — EPHEDRINE 5 MG/ML INJ
INTRAVENOUS | Status: AC
  Filled 2017-07-15: qty 4

## 2017-07-15 MED ORDER — LIDOCAINE HCL (PF) 1 % IJ SOLN
30.0000 mL | INTRAMUSCULAR | Status: DC | PRN
  Filled 2017-07-15: qty 30

## 2017-07-15 MED ORDER — EPHEDRINE 5 MG/ML INJ
10.0000 mg | INTRAVENOUS | Status: DC | PRN
  Filled 2017-07-15: qty 4

## 2017-07-15 MED ORDER — OXYCODONE-ACETAMINOPHEN 5-325 MG PO TABS: 2.0000 | ORAL_TABLET | ORAL | Status: DC | PRN

## 2017-07-15 MED ORDER — ACETAMINOPHEN 325 MG PO TABS
650.0000 mg | ORAL_TABLET | ORAL | Status: DC | PRN
  Administered 2017-07-15 – 2017-07-17 (×5): 650 mg via ORAL
  Filled 2017-07-15 (×6): qty 2

## 2017-07-15 MED ORDER — ONDANSETRON HCL 4 MG PO TABS: 4.0000 mg | ORAL_TABLET | ORAL | Status: DC | PRN

## 2017-07-15 MED ORDER — ACETAMINOPHEN 325 MG PO TABS: 325.0000 mg | ORAL_TABLET | ORAL | Status: DC | PRN

## 2017-07-15 MED ORDER — LACTATED RINGERS IV SOLN
500.0000 mL | Freq: Once | INTRAVENOUS | Status: AC
  Administered 2017-07-15: 500 mL via INTRAVENOUS

## 2017-07-15 MED ORDER — ONDANSETRON HCL 4 MG/2ML IJ SOLN
INTRAMUSCULAR | Status: DC | PRN
  Administered 2017-07-15: 4 mg via INTRAVENOUS

## 2017-07-15 MED ORDER — PHENYLEPHRINE 40 MCG/ML (10ML) SYRINGE FOR IV PUSH (FOR BLOOD PRESSURE SUPPORT)
80.0000 ug | PREFILLED_SYRINGE | INTRAVENOUS | Status: DC | PRN
  Administered 2017-07-15: 80 ug via INTRAVENOUS

## 2017-07-15 MED ORDER — ACETAMINOPHEN 325 MG PO TABS: 650.0000 mg | ORAL_TABLET | ORAL | Status: DC | PRN

## 2017-07-15 MED ORDER — SIMETHICONE 80 MG PO CHEW: 80.0000 mg | CHEWABLE_TABLET | ORAL | Status: DC | PRN

## 2017-07-15 MED ORDER — LACTATED RINGERS IV SOLN
INTRAVENOUS | Status: DC | PRN
  Administered 2017-07-15 (×3): via INTRAVENOUS

## 2017-07-15 MED ORDER — FENTANYL 2.5 MCG/ML BUPIVACAINE 1/10 % EPIDURAL INFUSION (WH - ANES)
INTRAMUSCULAR | Status: AC
  Filled 2017-07-15: qty 100

## 2017-07-15 MED ORDER — METHYLERGONOVINE MALEATE 0.2 MG/ML IJ SOLN
0.2000 mg | Freq: Once | INTRAMUSCULAR | Status: AC
  Administered 2017-07-15: 0.2 mg via INTRAMUSCULAR

## 2017-07-15 MED ORDER — OXYCODONE-ACETAMINOPHEN 5-325 MG PO TABS: 1.0000 | ORAL_TABLET | ORAL | Status: DC | PRN

## 2017-07-15 MED ORDER — COCONUT OIL OIL: 1.0000 "application " | TOPICAL_OIL | Status: DC | PRN

## 2017-07-15 MED ORDER — PHENYLEPHRINE HCL 10 MG/ML IJ SOLN
INTRAMUSCULAR | Status: DC | PRN
  Administered 2017-07-15 (×5): 80 ug via INTRAVENOUS

## 2017-07-15 MED ORDER — PHENYLEPHRINE 40 MCG/ML (10ML) SYRINGE FOR IV PUSH (FOR BLOOD PRESSURE SUPPORT): 80.0000 ug | PREFILLED_SYRINGE | INTRAVENOUS | Status: DC | PRN

## 2017-07-15 MED ORDER — OXYCODONE HCL 5 MG/5ML PO SOLN: 5.0000 mg | Freq: Once | ORAL | Status: DC | PRN

## 2017-07-15 MED ORDER — MIDAZOLAM HCL 5 MG/5ML IJ SOLN
INTRAMUSCULAR | Status: DC | PRN
  Administered 2017-07-15: 1 mg via INTRAVENOUS

## 2017-07-15 MED ORDER — OXYTOCIN BOLUS FROM INFUSION
500.0000 mL | Freq: Once | INTRAVENOUS | Status: AC
  Administered 2017-07-15: 500 mL via INTRAVENOUS

## 2017-07-15 MED ORDER — ONDANSETRON HCL 4 MG/2ML IJ SOLN: 4.0000 mg | INTRAMUSCULAR | Status: DC | PRN

## 2017-07-15 MED ORDER — OXYCODONE HCL 5 MG PO TABS: 5.0000 mg | ORAL_TABLET | Freq: Once | ORAL | Status: DC | PRN

## 2017-07-15 MED ORDER — OXYTOCIN 40 UNITS IN LACTATED RINGERS INFUSION - SIMPLE MED: 1.0000 m[IU]/min | INTRAVENOUS | Status: DC

## 2017-07-15 MED ORDER — SODIUM BICARBONATE 8.4 % IV SOLN
INTRAVENOUS | Status: AC
Start: 2017-07-15 — End: 2017-07-15
  Filled 2017-07-15: qty 50

## 2017-07-15 MED ORDER — FENTANYL CITRATE (PF) 100 MCG/2ML IJ SOLN: 25.0000 ug | INTRAMUSCULAR | Status: DC | PRN

## 2017-07-15 MED ORDER — PRENATAL MULTIVITAMIN CH
1.0000 | ORAL_TABLET | Freq: Every day | ORAL | Status: DC
  Administered 2017-07-16 – 2017-07-17 (×2): 1 via ORAL
  Filled 2017-07-15 (×2): qty 1

## 2017-07-15 MED ORDER — OXYTOCIN 40 UNITS IN LACTATED RINGERS INFUSION - SIMPLE MED
1.0000 m[IU]/min | INTRAVENOUS | Status: DC
  Administered 2017-07-15: 2 m[IU]/min via INTRAVENOUS
  Filled 2017-07-15: qty 1000

## 2017-07-15 MED ORDER — SODIUM BICARBONATE 8.4 % IV SOLN
INTRAVENOUS | Status: DC | PRN
  Administered 2017-07-15: 5 mL via EPIDURAL
  Administered 2017-07-15: 4 mL via EPIDURAL
  Administered 2017-07-15: 3 mL via EPIDURAL

## 2017-07-15 MED ORDER — ONDANSETRON HCL 4 MG/2ML IJ SOLN: 4.0000 mg | Freq: Once | INTRAMUSCULAR | Status: DC | PRN

## 2017-07-15 MED ORDER — LACTATED RINGERS IV SOLN
500.0000 mL | INTRAVENOUS | Status: DC | PRN
  Administered 2017-07-15: 1000 mL via INTRAVENOUS

## 2017-07-15 MED ORDER — DIPHENHYDRAMINE HCL 25 MG PO CAPS: 25.0000 mg | ORAL_CAPSULE | Freq: Four times a day (QID) | ORAL | Status: DC | PRN

## 2017-07-15 MED ORDER — METHYLERGONOVINE MALEATE 0.2 MG/ML IJ SOLN
INTRAMUSCULAR | Status: AC
  Filled 2017-07-15: qty 1

## 2017-07-15 MED ORDER — DIBUCAINE 1 % RE OINT: 1.0000 "application " | TOPICAL_OINTMENT | RECTAL | Status: DC | PRN

## 2017-07-15 MED ORDER — IBUPROFEN 600 MG PO TABS
600.0000 mg | ORAL_TABLET | Freq: Four times a day (QID) | ORAL | Status: DC
  Administered 2017-07-16 – 2017-07-17 (×7): 600 mg via ORAL
  Filled 2017-07-15 (×7): qty 1

## 2017-07-15 MED ORDER — MIDAZOLAM HCL 2 MG/2ML IJ SOLN
INTRAMUSCULAR | Status: AC
  Filled 2017-07-15: qty 2

## 2017-07-15 MED ORDER — OXYCODONE HCL 5 MG PO TABS
5.0000 mg | ORAL_TABLET | ORAL | Status: DC | PRN
  Administered 2017-07-16 – 2017-07-17 (×3): 5 mg via ORAL
  Filled 2017-07-15 (×3): qty 1

## 2017-07-15 MED ORDER — BENZOCAINE-MENTHOL 20-0.5 % EX AERO
1.0000 "application " | INHALATION_SPRAY | CUTANEOUS | Status: DC | PRN
  Administered 2017-07-16: 1 via TOPICAL
  Filled 2017-07-15: qty 56

## 2017-07-15 MED ORDER — WITCH HAZEL-GLYCERIN EX PADS: 1.0000 "application " | MEDICATED_PAD | CUTANEOUS | Status: DC | PRN

## 2017-07-15 MED ORDER — DIPHENHYDRAMINE HCL 50 MG/ML IJ SOLN: 12.5000 mg | INTRAMUSCULAR | Status: DC | PRN

## 2017-07-15 MED ORDER — EPHEDRINE 5 MG/ML INJ
10.0000 mg | INTRAVENOUS | Status: AC | PRN
  Administered 2017-07-15 (×2): 10 mg via INTRAVENOUS

## 2017-07-15 MED ORDER — FENTANYL 2.5 MCG/ML BUPIVACAINE 1/10 % EPIDURAL INFUSION (WH - ANES)
14.0000 mL/h | INTRAMUSCULAR | Status: DC | PRN
  Administered 2017-07-15: 14 mL/h via EPIDURAL

## 2017-07-15 MED ORDER — OXYTOCIN 40 UNITS IN LACTATED RINGERS INFUSION - SIMPLE MED: 2.5000 [IU]/h | INTRAVENOUS | Status: DC

## 2017-07-15 MED ORDER — TERBUTALINE SULFATE 1 MG/ML IJ SOLN
0.2500 mg | Freq: Once | INTRAMUSCULAR | Status: DC | PRN
  Filled 2017-07-15: qty 1

## 2017-07-15 MED ORDER — SOD CITRATE-CITRIC ACID 500-334 MG/5ML PO SOLN: 30.0000 mL | ORAL | Status: DC | PRN

## 2017-07-15 MED ORDER — MEPERIDINE HCL 25 MG/ML IJ SOLN: 6.2500 mg | INTRAMUSCULAR | Status: DC | PRN

## 2017-07-15 SURGICAL SUPPLY — 22 items
BENZOIN TINCTURE PRP APPL 2/3 (GAUZE/BANDAGES/DRESSINGS) ×3 IMPLANT
CLIP FILSHIE TUBAL LIGA STRL (Clip) ×6 IMPLANT
CLOSURE WOUND 1/2 X4 (GAUZE/BANDAGES/DRESSINGS) ×1
CLOTH BEACON ORANGE TIMEOUT ST (SAFETY) ×3 IMPLANT
DRSG OPSITE POSTOP 3X4 (GAUZE/BANDAGES/DRESSINGS) ×3 IMPLANT
DURAPREP 26ML APPLICATOR (WOUND CARE) ×3 IMPLANT
GLOVE BIOGEL PI IND STRL 7.0 (GLOVE) ×3 IMPLANT
GLOVE BIOGEL PI INDICATOR 7.0 (GLOVE) ×6
GLOVE ECLIPSE 7.0 STRL STRAW (GLOVE) ×3 IMPLANT
GOWN STRL REUS W/TWL LRG LVL3 (GOWN DISPOSABLE) ×6 IMPLANT
GOWN STRL REUS W/TWL XL LVL3 (GOWN DISPOSABLE) ×3 IMPLANT
NEEDLE HYPO 22GX1.5 SAFETY (NEEDLE) ×3 IMPLANT
NS IRRIG 1000ML POUR BTL (IV SOLUTION) ×3 IMPLANT
PACK ABDOMINAL MINOR (CUSTOM PROCEDURE TRAY) ×3 IMPLANT
PROTECTOR NERVE ULNAR (MISCELLANEOUS) ×3 IMPLANT
STRIP CLOSURE SKIN 1/2X4 (GAUZE/BANDAGES/DRESSINGS) ×2 IMPLANT
SUT VIC AB 0 CT1 27 (SUTURE) ×2
SUT VIC AB 0 CT1 27XBRD ANBCTR (SUTURE) ×1 IMPLANT
SUT VIC AB 4-0 PS2 27 (SUTURE) ×3 IMPLANT
SYR CONTROL 10ML LL (SYRINGE) ×3 IMPLANT
TOWEL OR 17X24 6PK STRL BLUE (TOWEL DISPOSABLE) ×6 IMPLANT
TRAY FOLEY CATH 16FR SILVER (SET/KITS/TRAYS/PACK) ×3 IMPLANT

## 2017-07-15 NOTE — Transfer of Care (Signed)
Immediate Anesthesia Transfer of Care Note  Patient: Dominique Caldwell  Procedure(s) Performed: POST PARTUM TUBAL LIGATION (Bilateral )  Patient Location: PACU  Anesthesia Type:Epidural  Level of Consciousness: awake, alert  and oriented  Airway & Oxygen Therapy: Patient Spontanous Breathing  Post-op Assessment: Report given to RN and Post -op Vital signs reviewed and stable  Post vital signs: Reviewed and stable  Last Vitals:  Vitals Value Taken Time  BP    Temp    Pulse 75 07/15/2017  6:07 PM  Resp 16 07/15/2017  6:07 PM  SpO2 96 % 07/15/2017  6:07 PM  Vitals shown include unvalidated device data.  Last Pain:  Vitals:   07/15/17 1546  TempSrc: Oral  PainSc:          Complications: No apparent anesthesia complications

## 2017-07-15 NOTE — Progress Notes (Signed)
D Lawson advised RN to administer phenylephrine.

## 2017-07-15 NOTE — Progress Notes (Signed)
Pt asymptomatic, talk with husband. D Lawson notified and advised RN to administer 16m ephedrine and 1008mbolus

## 2017-07-15 NOTE — Progress Notes (Signed)
Patient desires permanent sterilization.  Other reversible forms of contraception were discussed with patient; she declines all other modalities. Risks of procedure discussed with patient including but not limited to: risk of regret, permanence of method, bleeding, infection, injury to surrounding organs and need for additional procedures.  Failure risk of 1-2 % with increased risk of ectopic gestation if pregnancy occurs was also discussed with patient.  Patient verbalized understanding of these risks and wants to proceed with sterilization.  Written informed consent obtained.  To OR when ready.  Almyra Free P. Degele, MD OB Fellow

## 2017-07-15 NOTE — Progress Notes (Signed)
Dominique Caldwell is a 42 y.o. [id number] at 6w0dby ultrasound [facility]  Subjective:   Objective: BP 105/66   Pulse 73   Temp 98.2 F (36.8 C) (Oral)   Resp 16   Ht 4' 9"  (1.448 m)   Wt 145 lb (65.8 kg)   LMP 10/08/2016 (Exact Date)   BMI 31.38 kg/m  No intake/output data recorded. No intake/output data recorded.  FHT:  FHR: 120's bpm, variability: moderate,  accelerations:  Present,  decelerations:  Absent UC:   regular, every 2 minutes SVE:   Dilation: 1 Effacement (%): 80, 70 Station: -1 Exam by:: D Hollye Pritt  Labs: Lab Results  Component Value Date   WBC 7.1 07/15/2017   HGB 12.4 07/15/2017   HCT 36.5 07/15/2017   MCV 88.6 07/15/2017   PLT 271 07/15/2017    Assessment / Plan: Augmentation of labor, progressing well  Labor: Progressing normally Preeclampsia:  no signs or symptoms of toxicity Fetal Wellbeing:  Category I Pain Control:  IV pain meds I/D:  n/a Anticipated MOD:  NSVD  MKoren Shiver4/14/2019, 9:59 AM

## 2017-07-15 NOTE — Op Note (Signed)
Dominique Caldwell 07/15/2017  PREOPERATIVE DIAGNOSIS:  Undesired fertility  POSTOPERATIVE DIAGNOSIS:  Undesired fertility  PROCEDURE:  Postpartum Bilateral Tubal Sterilization using Filshie Clips   SURGEON: Surgeon(s) and Role:    * Lavonia Drafts, MD - Primary    * Mason Dibiasio, Jenne Pane, MD -  Crissie Figures, MD - OB Fellow  ANESTHESIA:  Epidural  COMPLICATIONS:  None immediate.  ESTIMATED BLOOD LOSS:  5 mL  FLUIDS: 2,000 cc LR.  INDICATIONS: 42 y.o. yo [phone]  with undesired fertility,status post vaginal delivery, desires permanent sterilization. Risks and benefits of procedure discussed with patient including permanence of method, bleeding, infection, injury to surrounding organs and need for additional procedures. Risk failure of 0.5-1% with increased risk of ectopic gestation if pregnancy occurs was also discussed with patient.   FINDINGS:  Normal uterus, tubes, and ovaries.  TECHNIQUE:  The patient was taken to the operating room where her epidural anesthesia was dosed up to surgical level and found to be adequate.  She was then placed in the dorsal supine position and prepped and draped in sterile fashion.  After an adequate timeout was performed, attention was turned to the patient's abdomen where a small transverse skin incision was made under the umbilical fold. The incision was taken down to the layer of fascia using the scalpel, and fascia was incised, and extended bilaterally using Mayo scissors. The peritoneum was entered in a sharp fashion. Attention was then turned to the patient's uterus, and right fallopian tube was identified and followed out to the fimbriated end.  A Filshie clip was placed on the left fallopian tube about 2 cm from the cornual attachment, with care given to incorporate the underlying mesosalpinx.  A similar process was carried out on the left side allowing for bilateral tubal sterilization.  Good hemostasis was noted overall. The instruments were then  removed from the patient's abdomen and the fascial incision was repaired with 0 Vicryl. Local anesthesia was injected in the subcutaneous tissue around incision, and the skin was closed with a 4-0 Vycril subcuticular stitch. The patient tolerated the procedure well.  Sponge, lap, and needle counts were correct times two.  The patient was then taken to the recovery room awake, extubated and in stable condition.   Morna Flud, Jenne Pane, MD OB Fellow 07/15/2017 5:59 PM

## 2017-07-15 NOTE — Anesthesia Procedure Notes (Signed)
Epidural Patient location during procedure: OB Start time: 07/15/2017 11:10 AM End time: 07/15/2017 11:15 AM  Staffing Anesthesiologist: Janeece Riggers, MD  Preanesthetic Checklist Completed: patient identified, site marked, surgical consent, pre-op evaluation, timeout performed, IV checked, risks and benefits discussed and monitors and equipment checked  Epidural Patient position: sitting Prep: site prepped and draped and DuraPrep Patient monitoring: continuous pulse ox and blood pressure Approach: midline Location: L3-L4 Injection technique: LOR air  Needle:  Needle type: Tuohy  Needle gauge: 17 G Needle length: 9 cm and 9 Needle insertion depth: 6 cm Catheter type: closed end flexible Catheter size: 19 Gauge Catheter at skin depth: 11 cm Test dose: negative  Assessment Events: blood not aspirated, injection not painful, no injection resistance, negative IV test and no paresthesia

## 2017-07-15 NOTE — Anesthesia Pain Management Evaluation Note (Signed)
  CRNA Pain Management Visit Note  Patient: Dominique Caldwell, 42 y.o., female  "Hello I am a member of the anesthesia team at [facility]. We have an anesthesia team available at all times to provide care throughout the hospital, including epidural management and anesthesia for C-section. I don't know your plan for the delivery whether it a natural birth, water birth, IV sedation, nitrous supplementation, doula or epidural, but we want to meet your pain goals."   1.Was your pain managed to your expectations on prior hospitalizations?   Yes   2.What is your expectation for pain management during this hospitalization?     Epidural  3.How can we help you reach that goal? epidural  Record the patient's initial score and the patient's pain goal.   Pain: 0  Pain Goal: 5 [facility] wants you to be able to say your pain was always managed very well.  Kathleen Likins 07/15/2017

## 2017-07-15 NOTE — Anesthesia Preprocedure Evaluation (Signed)
Anesthesia Evaluation  Patient identified by MRN, date of birth, ID band Patient awake    Reviewed: Allergy & Precautions, H&P , NPO status , Patient's Chart, lab work & pertinent test results, reviewed documented beta blocker date and time   Airway Mallampati: II  TM Distance: >3 FB Neck ROM: full    Dental no notable dental hx.    Pulmonary neg pulmonary ROS,    Pulmonary exam normal breath sounds clear to auscultation       Cardiovascular negative cardio ROS Normal cardiovascular exam Rhythm:regular Rate:Normal     Neuro/Psych negative neurological ROS  negative psych ROS   GI/Hepatic negative GI ROS, Neg liver ROS,   Endo/Other  negative endocrine ROS  Renal/GU negative Renal ROS  negative genitourinary   Musculoskeletal negative musculoskeletal ROS (+)   Abdominal   Peds negative pediatric ROS (+)  Hematology negative hematology ROS (+)   Anesthesia Other Findings   Reproductive/Obstetrics negative OB ROS (+) Pregnancy                             Anesthesia Physical  Anesthesia Plan  ASA: II  Anesthesia Plan: Epidural   Post-op Pain Management:    Induction:   PONV Risk Score and Plan:   Airway Management Planned: Nasal Cannula and Natural Airway  Additional Equipment:   Intra-op Plan:   Post-operative Plan:   Informed Consent: I have reviewed the patients History and Physical, chart, labs and discussed the procedure including the risks, benefits and alternatives for the proposed anesthesia with the patient or authorized representative who has indicated his/her understanding and acceptance.     Plan Discussed with: CRNA, Anesthesiologist and Surgeon  Anesthesia Plan Comments:         Anesthesia Quick Evaluation

## 2017-07-15 NOTE — Anesthesia Postprocedure Evaluation (Signed)
Anesthesia Post Note  Patient: Dominique Caldwell  Procedure(s) Performed: AN AD HOC LABOR EPIDURAL     Patient location during evaluation: Mother Baby Anesthesia Type: Epidural Level of consciousness: awake and alert Pain management: pain level controlled Vital Signs Assessment: post-procedure vital signs reviewed and stable Respiratory status: spontaneous breathing, nonlabored ventilation and respiratory function stable Cardiovascular status: stable Postop Assessment: no headache, no backache and epidural receding Anesthetic complications: no    Last Vitals:  Vitals:   07/15/17 1858 07/15/17 1922  BP: 118/67 110/67  Pulse: 88 76  Resp: 15 18  Temp: 36.6 C 36.4 C  SpO2: 100%     Last Pain:  Vitals:   07/15/17 1922  TempSrc: Oral  PainSc:                  Raimundo Corbit

## 2017-07-15 NOTE — Anesthesia Postprocedure Evaluation (Signed)
Anesthesia Post Note  Patient: Dominique Caldwell  Procedure(s) Performed: POST PARTUM TUBAL LIGATION (Bilateral )     Patient location during evaluation: Mother Baby Anesthesia Type: Epidural Level of consciousness: awake and alert Pain management: pain level controlled Vital Signs Assessment: post-procedure vital signs reviewed and stable Respiratory status: spontaneous breathing, nonlabored ventilation and respiratory function stable Cardiovascular status: stable Postop Assessment: no headache, no backache and epidural receding Anesthetic complications: no    Last Vitals:  Vitals:   07/15/17 1858 07/15/17 1922  BP: 118/67 110/67  Pulse: 88 76  Resp: 15 18  Temp: 36.6 C 36.4 C  SpO2: 100%     Last Pain:  Vitals:   07/15/17 1922  TempSrc: Oral  PainSc:                  [name]

## 2017-07-15 NOTE — H&P (Addendum)
LABOR AND DELIVERY ADMISSION HISTORY AND PHYSICAL NOTE  Charolett Yarrow is a 42 y.o. female [phone] with IUP at 79w0dby LMP presenting for IOL d/t AMA. Patient endorsing contractions around 0030 the morning of 4/14. She reports positive fetal movement. She denies leakage of fluid or vaginal bleeding.  [facility]:  - Advanced maternal age - unwanted fertility - Macrosomia of fetus (Sono 4/10: 451g 9lb 4oz, >90%) - Language barrier  Past Medical History: Past Medical History:  Diagnosis Date  . Medical history non-contributory     Past Surgical History: Past Surgical History:  Procedure Laterality Date  . NO PAST SURGERIES      Obstetrical History: OB History    Gravida  4   Para  3   Term  3   Preterm      AB      Living  3     SAB      TAB      Ectopic      Multiple      Live Births  3           Social History: Social History   Socioeconomic History  . Marital status: Single    Spouse name: Not on file  . Number of children: Not on file  . Years of education: Not on file  . Highest education level: Not on file  Occupational History  . Not on file  Social Needs  . Financial resource strain: Not on file  . Food insecurity:    Worry: Not on file    Inability: Not on file  . Transportation needs:    Medical: Not on file    Non-medical: Not on file  Tobacco Use  . Smoking status: Never Smoker  . Smokeless tobacco: Never Used  Substance and Sexual Activity  . Alcohol use: No  . Drug use: No  . Sexual activity: Yes    Partners: Male    Birth control/protection: None  Lifestyle  . Physical activity:    Days per week: Not on file    Minutes per session: Not on file  . Stress: Not on file  Relationships  . Social connections:    Talks on phone: Not on file    Gets together: Not on file    Attends religious service: Not on file    Active member of club or organization: Not  on file    Attends meetings of clubs or organizations: Not on file    Relationship status: Not on file  Other Topics Concern  . Not on file  Social History Narrative  . Not on file    Family History: Family History  Problem Relation Age of Onset  . Hypertension Mother   . Diabetes Sister   . Hypertension Brother     Allergies: No Known Allergies  Medications Prior to Admission  Medication Sig Dispense Refill Last Dose  . aspirin EC 81 MG tablet Take 1 tablet (81 mg total) by mouth daily. Take after 12 weeks for prevention of preeclampsia later in pregnancy 300 tablet 2 Taking  . Prenatal Vit-Fe Fumarate-FA (PRENATAL PO) Take by mouth.   Taking  . sulfamethoxazole-trimethoprim (BACTRIM DS,SEPTRA DS) 800-160 MG tablet Take 1 tablet by mouth 2 (two) times daily. (Patient not taking: Reported on 06/12/2017) 14 tablet 0 Not Taking     Review of Systems  All systems reviewed and negative except as stated in HPI  Physical Exam Blood pressure 107/70,  pulse 76, temperature 97.8 F (36.6 C), temperature source Oral, resp. rate 18, last menstrual period 10/08/2016. General appearance: alert, oriented, NAD Lungs: normal respiratory effort Heart: regular rate Abdomen: soft, non-tender; gravid, FH appropriate for GA Extremities: No calf swelling or tenderness Presentation: cephalic Fetal monitoring: 125 bpm, mod variability, accelerations present, no decelerations Uterine activity: irregular, every 3-5 minutes Dilation: 1.5 Effacement (%): 60 Station: -3 Exam by:: Dr Gerarda Fraction  Prenatal labs: ABO, Rh: O/Positive/-- (10/03 1056) Antibody: Negative (10/03 1056) Rubella: 21.40 (10/03 1056) RPR: Non Reactive (01/24 1016)  HBsAg: Negative (10/03 1056)  HIV: Non Reactive (01/24 1016)  GC/Chlamydia: negative GBS: Negative (03/19 1121)  2-hr GTT: WNL Genetic screening:  declined Anatomy US: WNL  Prenatal Transfer Tool  Maternal Diabetes: No Genetic Screening: Declined Maternal  Ultrasounds/Referrals: Normal Fetal Ultrasounds or other Referrals:  None Maternal Substance Abuse:  No Significant Maternal Medications:  None Significant Maternal Lab Results: Lab values include: Group B Strep negative  No results found for this or any previous visit (from the past 24 hour(s)).  Patient Active Problem List   Diagnosis Date Noted  . Encounter for induction of labor 07/15/2017  . Macrosomia of fetus affecting management of mother 06/19/2017  . Language barrier 06/19/2017  . Advanced maternal age in multigravida 06/05/2017  . Unwanted fertility 05/08/2017  . Encounter for supervision of high risk multigravida of advanced maternal age, antepartum 01/03/2017    Assessment: Alexandr Oehler is a 42 y.o. G4P3003 at 69w0dhere for [facility]OL [facility] significant for unwanted fertility, macrosomia (>90%ile).  #Labor: expectant management at this time since patient came in with early labor. Will augment if no change in a few hours.  #Pain: IV pain meds PRN, epidural available upon request #FWB: Category 1 #ID: negative #MOF: breast #MOC:BTL #Circ:  no  DRandolph Bing4/14/2019, 3:20 AM  OB FELLOW HISTORY AND PHYSICAL ATTESTATION  I confirm that I have verified the information documented in the resident's note and that I have also personally reperformed the physical exam and all medical decision making activities. I agree with above documentation and have made edits as needed.   JLuiz Blare DO OB Fellow 07/15/2017, 4:35 AM

## 2017-07-15 NOTE — Anesthesia Postprocedure Evaluation (Signed)
Anesthesia Post Note  Patient: Dominique Caldwell  Procedure(s) Performed: AN AD HOC LABOR EPIDURAL     Patient location during evaluation: Mother Baby Anesthesia Type: Epidural Level of consciousness: awake and alert and oriented Pain management: satisfactory to patient Vital Signs Assessment: post-procedure vital signs reviewed and stable Respiratory status: respiratory function stable Cardiovascular status: stable Postop Assessment: no headache, no backache, epidural receding, patient able to bend at knees, no signs of nausea or vomiting and adequate PO intake Anesthetic complications: no    Last Vitals:  Vitals:   07/15/17 1431 07/15/17 1447  BP: 115/61 102/83  Pulse: 96 99  Resp: 17 18  Temp:      Last Pain:  Vitals:   07/15/17 1253  TempSrc:   PainSc: Asleep   Pain Goal:                 Aedan Geimer

## 2017-07-15 NOTE — Anesthesia Preprocedure Evaluation (Signed)
Anesthesia Evaluation  Patient identified by MRN, date of birth, ID band Patient awake    Reviewed: Allergy & Precautions, H&P , NPO status , Patient's Chart, lab work & pertinent test results, reviewed documented beta blocker date and time   Airway Mallampati: II  TM Distance: >3 FB Neck ROM: full    Dental no notable dental hx.    Pulmonary neg pulmonary ROS,    Pulmonary exam normal breath sounds clear to auscultation       Cardiovascular negative cardio ROS Normal cardiovascular exam Rhythm:regular Rate:Normal     Neuro/Psych negative neurological ROS  negative psych ROS   GI/Hepatic negative GI ROS, Neg liver ROS,   Endo/Other  negative endocrine ROS  Renal/GU negative Renal ROS  negative genitourinary   Musculoskeletal negative musculoskeletal ROS (+)   Abdominal   Peds negative pediatric ROS (+)  Hematology negative hematology ROS (+)   Anesthesia Other Findings   Reproductive/Obstetrics negative OB ROS (+) Pregnancy                             Anesthesia Physical Anesthesia Plan  ASA: II  Anesthesia Plan: Epidural   Post-op Pain Management:    Induction:   PONV Risk Score and Plan:   Airway Management Planned:   Additional Equipment:   Intra-op Plan:   Post-operative Plan:   Informed Consent: I have reviewed the patients History and Physical, chart, labs and discussed the procedure including the risks, benefits and alternatives for the proposed anesthesia with the patient or authorized representative who has indicated his/her understanding and acceptance.     Plan Discussed with:   Anesthesia Plan Comments:         Anesthesia Quick Evaluation

## 2017-07-16 ENCOUNTER — Encounter (HOSPITAL_COMMUNITY): Payer: Self-pay | Admitting: Obstetrics & Gynecology

## 2017-07-16 ENCOUNTER — Encounter (HOSPITAL_COMMUNITY): Payer: Self-pay

## 2017-07-16 NOTE — Addendum Note (Signed)
Addendum  created 07/16/17 0954 by Hewitt Blade, CRNA   Sign clinical note

## 2017-07-16 NOTE — Progress Notes (Signed)
Post Partum Day 1 Subjective: no complaints, up ad lib, voiding and tolerating PO  Objective: Blood pressure 101/63, pulse 82, temperature (!) 97 F (36.1 C), temperature source Axillary, resp. rate 18, height 4' 9"  (1.448 m), weight 145 lb (65.8 kg), last menstrual period 10/08/2016, SpO2 97 %.  Physical Exam:  General: alert, cooperative and no distress Lochia: appropriate Uterine Fundus: firm DVT Evaluation: No evidence of DVT seen on physical exam.  Recent Labs    07/15/17 0312  HGB 12.4  HCT 36.5    Assessment/Plan: Plan for discharge tomorrow, Breastfeeding and Lactation consult  Continue current care  Declined interpretor for the visit. Spoke [place].   LOS: 1 day   Dominique Blare, DO 07/16/2017, 9:40 AM

## 2017-07-16 NOTE — Lactation Note (Signed)
This note was copied from a baby's chart. Lactation Consultation Note  Patient Name: Dominique Caldwell Date: 07/16/2017 Reason for consult: Initial assessment;Term  48 hours old FT female who is being partially BF but mostly formula feed by his mother at this point,  that was her feeding choice upon admission. She is experienced BF, she was able to BF her other kids for 4-5 months. Mom has only BF a few times, per mom every time she tries baby won't latch, offered latch assistance but mom declined saying that she'll try again "on her own". Explained to mom that while in the hospital she can call for latch assistance any anytime; mom verbalized understanding.  Encouraged mom to call for latch assistance and to to priorizing BF over formula feeding, taking baby to the breast STS 8-12 times/day. Reviewed BF brochure, BF resources and feeding diary, mom is aware of [facility] and will call PRN.  Maternal Data Formula Feeding for Exclusion: Yes Reason for exclusion: Mother's choice to formula and breast feed on admission Has patient been taught Hand Expression?: Yes Does the patient have breastfeeding experience prior to this delivery?: Yes  Feeding Feeding Type: Bottle Fed - Formula Nipple Type: Slow - flow    Interventions Interventions: Breast feeding basics reviewed  Lactation Tools Discussed/Used WIC Program: No   Consult Status Consult Status: PRN    Dominique Caldwell [name] 07/16/2017, 4:21 PM

## 2017-07-16 NOTE — Anesthesia Postprocedure Evaluation (Signed)
Anesthesia Post Note  Patient: Dominique Caldwell  Procedure(s) Performed: POST PARTUM TUBAL LIGATION (Bilateral )     Patient location during evaluation: Mother Baby Anesthesia Type: Epidural Level of consciousness: awake and alert Pain management: pain level controlled Vital Signs Assessment: post-procedure vital signs reviewed and stable Respiratory status: spontaneous breathing, nonlabored ventilation and respiratory function stable Cardiovascular status: stable Postop Assessment: no headache, no backache, epidural receding, adequate PO intake, no apparent nausea or vomiting and patient able to bend at knees Anesthetic complications: no    Last Vitals:  Vitals:   07/16/17 0420 07/16/17 0839  BP: 97/62 101/63  Pulse: 71 82  Resp: 16 18  Temp: 36.4 C (!) 36.1 C  SpO2: 97%     Last Pain:  Vitals:   07/16/17 0839  TempSrc: Axillary  PainSc:    Pain Goal:                 [employer]

## 2017-07-17 ENCOUNTER — Encounter (HOSPITAL_COMMUNITY): Payer: Self-pay | Admitting: *Deleted

## 2017-07-17 MED ORDER — OXYCODONE HCL 5 MG PO TABS: 5.0000 mg | ORAL_TABLET | Freq: Four times a day (QID) | ORAL | 0 refills | Status: DC | PRN

## 2017-07-17 MED ORDER — IBUPROFEN 600 MG PO TABS: 600.0000 mg | ORAL_TABLET | Freq: Four times a day (QID) | ORAL | 0 refills | Status: AC

## 2017-07-17 MED ORDER — SENNOSIDES-DOCUSATE SODIUM 8.6-50 MG PO TABS: 2.0000 | ORAL_TABLET | Freq: Every evening | ORAL | 0 refills | Status: AC | PRN

## 2017-07-17 NOTE — Discharge Summary (Signed)
OB Discharge Summary     Patient Name: Dominique Caldwell DOB: 1975/08/12 MRN: [id number]  Date of admission: 07/15/2017 Delivering MD: Koren Shiver D   Date of discharge: 07/17/2017  Admitting diagnosis: 61 WK CTX Intrauterine pregnancy: [redacted]w[redacted]d    Secondary diagnosis:  Active Problems:   Encounter for induction of labor  Additional problems:  - Advanced maternal age 42 - unwanted fertility - Language barrier    Discharge diagnosis: Term Pregnancy Delivered                                                                                                Post partum procedures:postpartum tubal ligation  Augmentation: AROM and Pitocin  Complications: None  Hospital course:  Onset of Labor With Vaginal Delivery     42y.o. yo [id number] 42w2das admitted in Active Labor on 07/15/2017. Patient had an uncomplicated labor course as follows:  Membrane Rupture Time/Date: 9:57 AM ,07/15/2017   Intrapartum Procedures: Episiotomy: None [1]                                         Lacerations:  3rd degree [4];Perineal [11]  Patient had a delivery of a Viable infant. 07/15/2017  Information for the patient's newborn:  MaZanovia, Rotz0[[id number]]Delivery Method: Vaginal, Vacuum (Extractor)(Filed from Delivery Summary)    Pateint had an uncomplicated postpartum course.  She is ambulating, tolerating a regular diet, passing flatus, and urinating well. Patient is discharged home in stable condition on 07/17/17.   Physical exam  Vitals:   07/16/17 0420 07/16/17 0839 07/16/17 1844 07/17/17 0537  BP: 97/62 101/63 106/70 104/62  Pulse: 71 82 77 69  Resp: 16 18 19 18   Temp: 97.6 F (36.4 C) (!) 97 F (36.1 C) 97.7 F (36.5 C) 98 F (36.7 C)  TempSrc: Oral Axillary Oral Oral  SpO2: 97%     Weight:      Height:       General: alert, cooperative and no distress Lochia: appropriate Uterine Fundus: firm Incision: N/A DVT Evaluation: No evidence of DVT seen on physical exam. Labs: Lab  Results  Component Value Date   WBC 7.1 07/15/2017   HGB 12.4 07/15/2017   HCT 36.5 07/15/2017   MCV 88.6 07/15/2017   PLT 271 07/15/2017   No flowsheet data found.  Discharge instruction: per After Visit Summary and "Baby and Me Booklet".  After visit meds:  Allergies as of 07/17/2017   No Known Allergies     Medication List    STOP taking these medications   aspirin EC 81 MG tablet   sulfamethoxazole-trimethoprim 800-160 MG tablet Commonly known as:  BACTRIM DS,SEPTRA DS     TAKE these medications   calcium carbonate 500 MG chewable tablet Commonly known as:  TUMS - dosed in mg elemental calcium Chew 1 tablet by mouth daily as needed for indigestion or heartburn.   ibuprofen 600 MG tablet Commonly known as:  ADVIL,MOTRIN Take 1 tablet (600 mg total) by  mouth every 6 (six) hours.   oxyCODONE 5 MG immediate release tablet Commonly known as:  Oxy IR/ROXICODONE Take 1 tablet (5 mg total) by mouth every 6 (six) hours as needed for severe pain.   prenatal multivitamin Tabs tablet Take 1 tablet by mouth daily at 12 noon.   senna-docusate 8.6-50 MG tablet Commonly known as:  Senokot-S Take 2 tablets by mouth at bedtime as needed for mild constipation.       Diet: routine diet  Activity: Advance as tolerated. Pelvic rest for 6 weeks.   Outpatient follow up:6 weeks Follow up Appt: Future Appointments  Date Time Provider [place]  08/14/2017  8:30 AM Constant, Vickii Chafe, MD [place] None   Follow up Visit:No follow-ups on file.  Postpartum contraception: Tubal Ligation  Newborn Data: Live born female  Birth Weight: 8 lb 3 oz (3715 g) APGAR: 7, 8  Newborn Delivery   Birth date/time:  07/15/2017 12:17:00 Delivery type:  Vaginal, Vacuum (Extractor)     Baby Feeding: Breast Disposition:home with mother   07/17/2017 Luiz Blare, DO

## 2017-07-17 NOTE — Discharge Instructions (Signed)
Home Care Instructions for Mom ACTIVITY  Gradually return to your regular activities.  Let yourself rest. Nap while your baby sleeps.  Avoid lifting anything that is heavier than 10 lb (4.5 kg) until your health care provider says it is okay.  Avoid activities that take a lot of effort and energy (are strenuous) until approved by your health care provider. Walking at a slow-to-moderate pace is usually safe.  If you had a cesarean delivery: ? Do not vacuum, climb stairs, or drive a car for 4-6 weeks. ? Have someone help you at home until you feel like you can do your usual activities yourself. ? Do exercises as told by your health care provider, if this applies.  VAGINAL BLEEDING You may continue to bleed for 4-6 weeks after delivery. Over time, the amount of blood usually decreases and the color of the blood usually gets lighter. However, the flow of bright red blood may increase if you have been too active. If you need to use more than one pad in an hour because your pad gets soaked, or if you pass a large clot:  Lie down.  Raise your feet.  Place a cold compress on your lower abdomen.  Rest.  Call your health care provider.  If you are breastfeeding, your period should return anytime between 8 weeks after delivery and the time that you stop breastfeeding. If you are not breastfeeding, your period should return 6-8 weeks after delivery. PERINEAL CARE The perineal area, or perineum, is the part of your body between your thighs. After delivery, this area needs special care. Follow these instructions as told by your health care provider.  Take warm tub baths for 15-20 minutes.  Use medicated pads and pain-relieving sprays and creams as told.  Do not use tampons or douches until vaginal bleeding has stopped.  Each time you go to the bathroom: ? Use a peri bottle. ? Change your pad. ? Use towelettes in place of toilet paper until your stitches have healed.  Do Kegel exercises  every day. Kegel exercises help to maintain the muscles that support the vagina, bladder, and bowels. You can do these exercises while you are standing, sitting, or lying down. To do Kegel exercises: ? Tighten the muscles of your abdomen and the muscles that surround your birth canal. ? Hold for a few seconds. ? Relax. ? Repeat until you have done this 5 times in a row.  To prevent hemorrhoids from developing or getting worse: ? Drink enough fluid to keep your urine clear or pale yellow. ? Avoid straining when having a bowel movement. ? Take over-the-counter medicines and stool softeners as told by your health care provider.  BREAST CARE  Wear a tight-fitting bra.  Avoid taking over-the-counter pain medicine for breast discomfort.  Apply ice to the breasts to help with discomfort as needed: ? Put ice in a plastic bag. ? Place a towel between your skin and the bag. ? Leave the ice on for 20 minutes or as told by your health care provider.  NUTRITION  Eat a well-balanced diet.  Do not try to lose weight quickly by cutting back on calories.  Take your prenatal vitamins until your postpartum checkup or until your health care provider tells you to stop.  POSTPARTUM DEPRESSION You may find yourself crying for no apparent reason and unable to cope with all of the changes that come with having a newborn. This mood is called postpartum depression. Postpartum depression happens because your hormone  levels change after delivery. If you have postpartum depression, get support from your partner, friends, and family. If the depression does not go away on its own after several weeks, contact your health care provider. BREAST SELF-EXAM Do a breast self-exam each month, at the same time of the month. If you are breastfeeding, check your breasts just after a feeding, when your breasts are less full. If you are breastfeeding and your period has started, check your breasts on day 5, 6, or 7 of your  period. Report any lumps, bumps, or discharge to your health care provider. Know that breasts are normally lumpy if you are breastfeeding. This is temporary, and it is not a health risk. INTIMACY AND SEXUALITY Avoid sexual activity for at least 3-4 weeks after delivery or until the brownish-red vaginal flow is completely gone. If you want to avoid pregnancy, use some form of birth control. You can get pregnant after delivery, even if you have not had your period. SEEK MEDICAL CARE IF:  You feel unable to cope with the changes that a child brings to your life, and these feelings do not go away after several weeks.  You notice a lump, a bump, or discharge on your breast.  SEEK IMMEDIATE MEDICAL CARE IF:  Blood soaks your pad in 1 hour or less.  You have: ? Severe pain or cramping in your lower abdomen. ? A bad-smelling vaginal discharge. ? A fever that is not controlled by medicine. ? A fever, and an area of your breast is red and sore. ? Pain or redness in your calf. ? Sudden, severe chest pain. ? Shortness of breath. ? Painful or bloody urination. ? Problems with your vision.  You vomit for 12 hours or longer.  You develop a severe headache.  You have serious thoughts about hurting yourself, your child, or anyone else.  This information is not intended to replace advice given to you by your health care provider. Make sure you discuss any questions you have with your health care provider. Document Released: 03/17/2000 Document Revised: 08/26/2015 Document Reviewed: 09/21/2014 Elsevier Interactive Patient Education  2017 [employer].

## 2017-08-13 ENCOUNTER — Ambulatory Visit: Payer: Self-pay | Admitting: Obstetrics and Gynecology

## 2017-08-14 ENCOUNTER — Ambulatory Visit (INDEPENDENT_AMBULATORY_CARE_PROVIDER_SITE_OTHER): Payer: Self-pay | Admitting: Obstetrics and Gynecology

## 2017-08-14 ENCOUNTER — Encounter: Payer: Self-pay | Admitting: Obstetrics and Gynecology

## 2017-08-14 NOTE — Progress Notes (Signed)
Post Partum Exam  Dominique Caldwell is a 42 y.o. G25P3003 female who presents for a postpartum visit. She is 4 weeks postpartum following a spontaneous vaginal delivery. I have fully reviewed the prenatal and intrapartum course. The delivery was at 15w2dgestational weeks.  Anesthesia: epidural. Postpartum course has been unremarkable. Baby's course has been unremarkable. Baby is feeding by both breast and bottle - [employer] Good Start. Bleeding pink. Bowel function is normal. Bladder function is normal. Patient is not sexually active. Contraception method is tubal ligation. Postpartum depression screening:neg    Last pap smear done 01/03/17 and was Normal  Review of Systems Pertinent items are noted in HPI.    Objective:  Blood pressure 104/70, pulse 71, height 4' 8"  (1.422 m), weight 127 lb 9.6 oz (57.9 kg), unknown if currently breastfeeding.  General:  alert, cooperative and no distress   Breasts:  inspection negative, no nipple discharge or bleeding, no masses or nodularity palpable  Lungs: clear to auscultation bilaterally  Heart:  regular rate and rhythm  Abdomen: soft, non-tender; bowel sounds normal; no masses,  no organomegaly   Vulva:  normal  Vagina: normal vagina, no discharge, exudate, lesion, or erythema and third degree healing well  Cervix:  multiparous appearance  Corpus: normal size, contour, position, consistency, mobility, non-tender  Adnexa:  normal adnexa and no mass, fullness, tenderness  Rectal Exam: Not performed.        Assessment:    Normal postpartum exam. Pap smear not done at today's visit.   Plan:   1. Contraception: tubal ligation 2. Patient is medically cleared to resume all activities of daily living 3. Follow up in: 5 months for annual exam or as needed. Patient was also reminded that she is due for her mammogram

## 2017-12-18 IMAGING — US US MFM OB DETAIL+14 WK
1 series · 14 of 28 positions shown · non-contrast
Comparison: none

[Series 1: us mfm ob detail+14 wk · 14 of 79 slices shown]
[im 3/79]
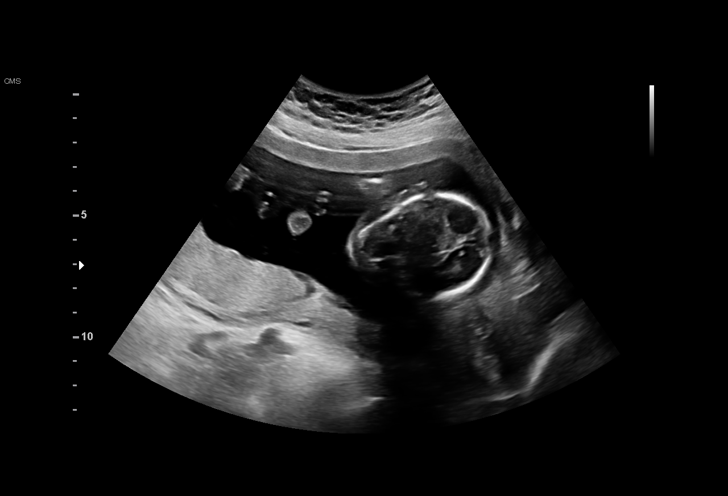
[im 9/79]
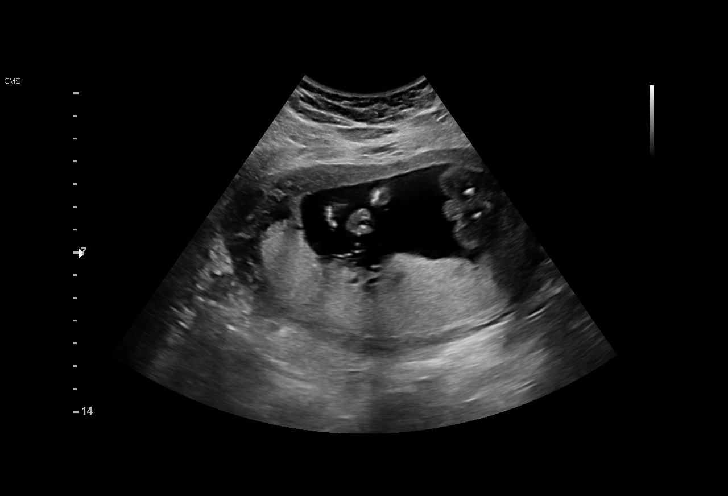
[im 15/79]
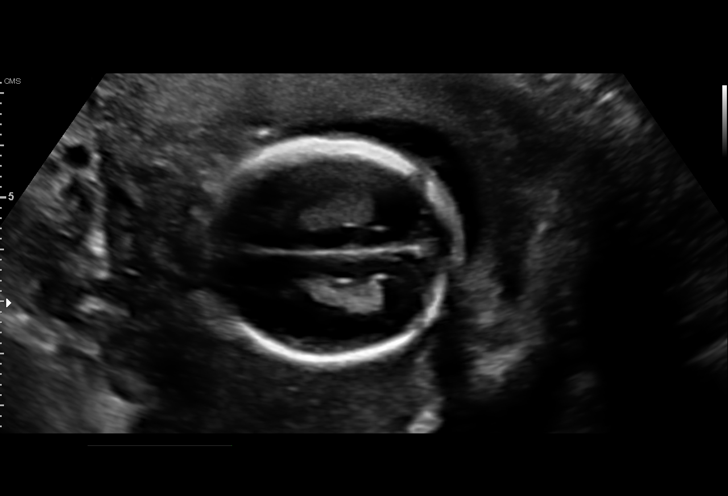
[im 21/79]
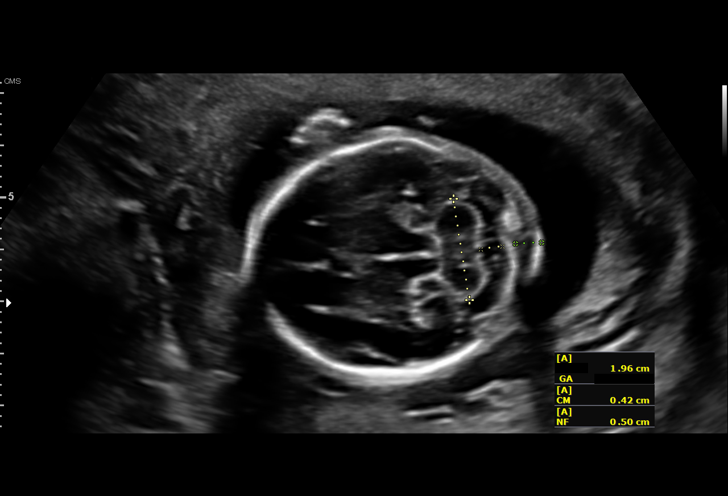
[im 27/79]
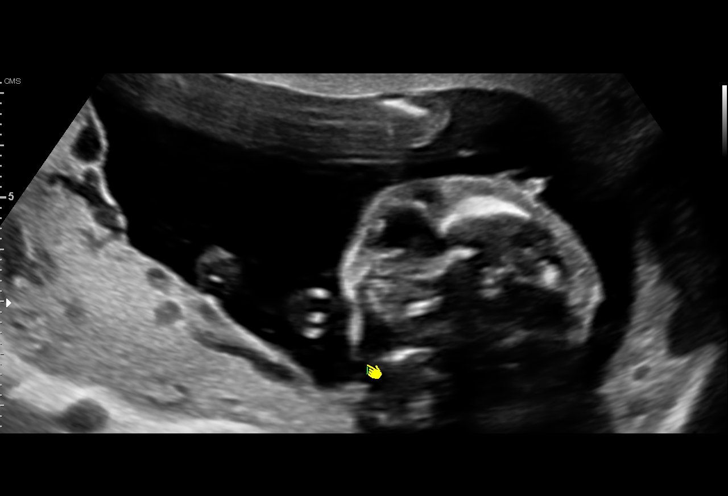
[im 32/79]
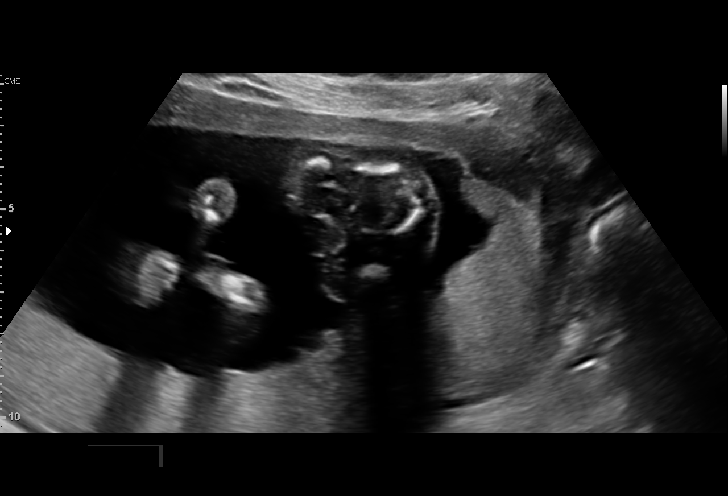
[im 38/79]
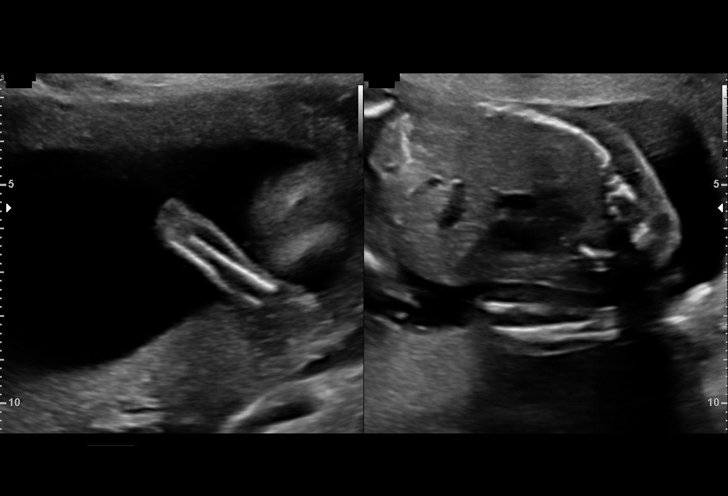
[im 44/79]
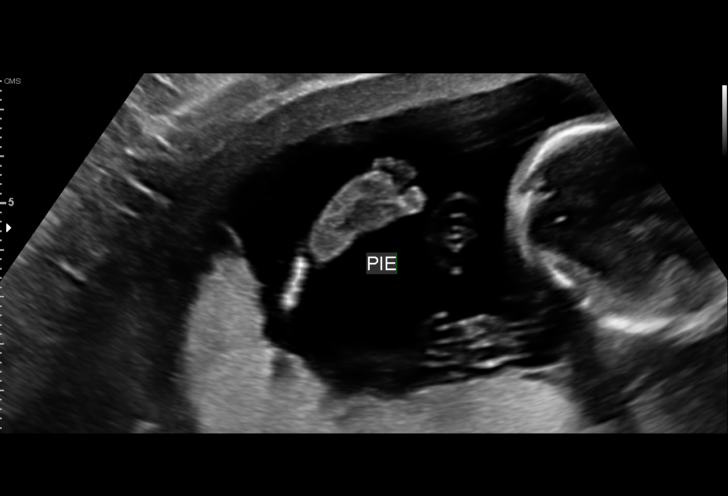
[im 50/79]
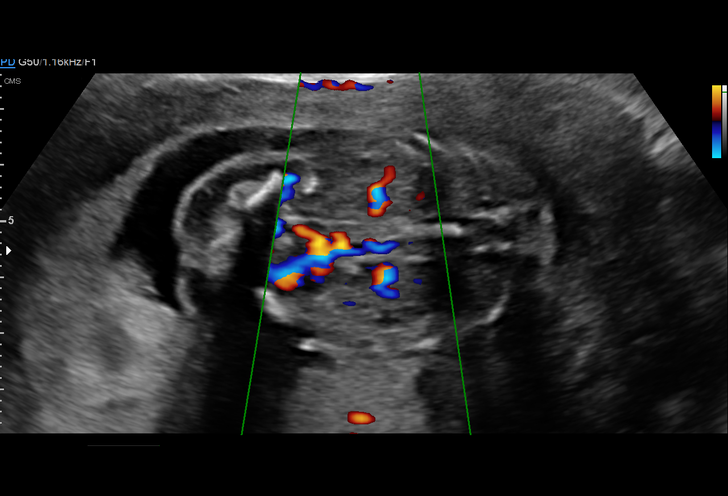
[im 55/79]
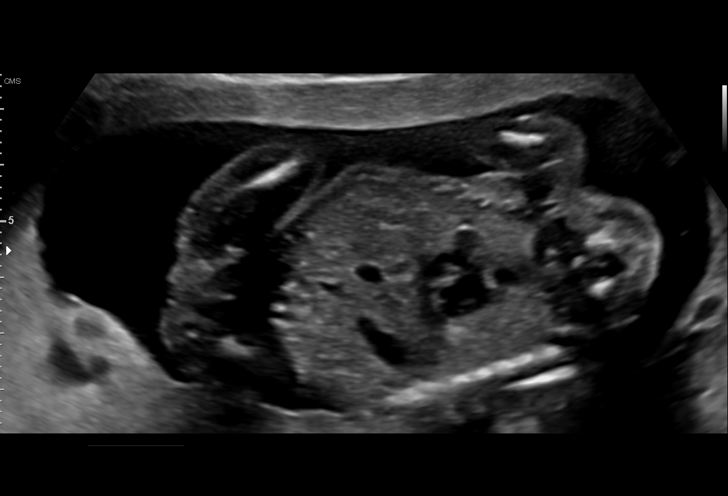
[im 61/79]
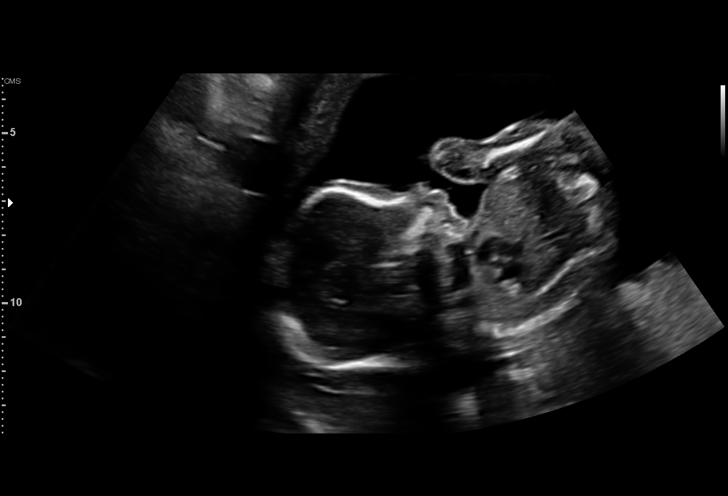
[im 67/79]
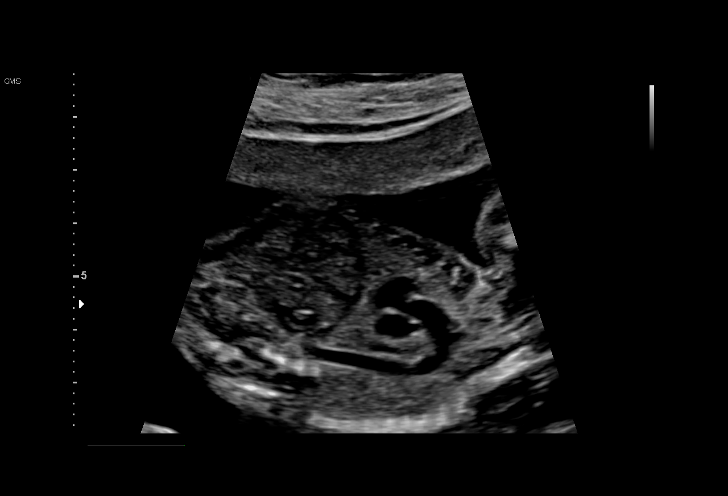
[im 73/79]
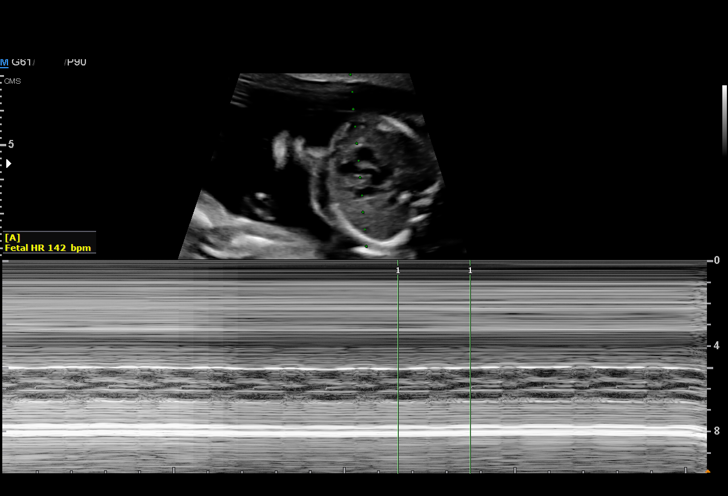
[im 79/79]
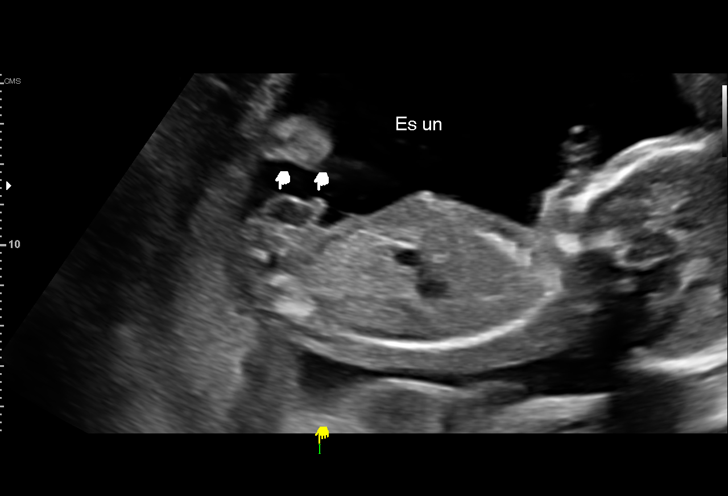

[14 of 28 positions shown; findings below may reference images not displayed]

[REDACTED]care -
[HOSPITAL]([HOSPITAL]
)

Indications

18 weeks gestation of pregnancy
Encounter for fetal anatomic survey
Advanced maternal age multigravida 35+,
third trimester; testing offered
OB History

Gravidity:    4         Term:   3        Prem:   0        SAB:   0
TOP:          0       Ectopic:  0        Living: 3
Fetal Evaluation

Num Of Fetuses:     1
Fetal Heart         142
Rate(bpm):
Cardiac Activity:   Observed
Presentation:       Cephalic
Placenta:           Posterior, above cervical os
P. Cord Insertion:  Visualized

Amniotic Fluid
AFI FV:      Subjectively within normal limits

Largest Pocket(cm)
5.65
Biometry
BPD:        44  mm     G. Age:  19w 2d         80  %    CI:        73.87   %    70 - 86
FL/HC:      17.6   %    16.1 -
HC:      162.6  mm     G. Age:  19w 0d         65  %    HC/AC:      1.10        1.09 -
AC:      147.8  mm     G. Age:  20w 1d         88  %    FL/BPD:     65.0   %
FL:       28.6  mm     G. Age:  18w 5d         52  %    FL/AC:      19.4   %    20 - 24
HUM:      27.4  mm     G. Age:  18w 5d         58  %
CER:      19.6  mm     G. Age:  18w 6d         58  %
NFT:         5  mm
CM:        4.2  mm

Est. FW:     291  gm    0 lb 10 oz      59  %
Gestational Age

LMP:           18w 4d        Date:  10/08/16                 EDD:   07/15/17
U/S Today:     19w 2d                                        EDD:   07/10/17
Best:          18w 4d     Det. By:  LMP  (10/08/16)          EDD:   07/15/17
Anatomy

Cranium:               Appears normal         Aortic Arch:            Appears normal
Cavum:                 Appears normal         Ductal Arch:            Appears normal
Ventricles:            Appears normal         Diaphragm:              Appears normal
Choroid Plexus:        Appears normal         Stomach:                Appears normal, left
sided
Cerebellum:            Appears normal         Abdomen:                Appears normal
Posterior Fossa:       Appears normal         Abdominal Wall:         Appears nml (cord
insert, abd wall)
Nuchal Fold:           Appears normal         Cord Vessels:           Appears normal (3
vessel cord)
Face:                  Appears normal         Kidneys:                Appear normal
(orbits and profile)
Lips:                  Appears normal         Bladder:                Appears normal
Thoracic:              Appears normal         Spine:                  Appears normal
Heart:                 Appears normal         Upper Extremities:      Appears normal
(4CH, axis, and
situs)
RVOT:                  Appears normal         Lower Extremities:      Appears normal
LVOT:                  Appears normal

Other:  Fetus appears to be a male. Heels and 5th digit visualized. Nasal
bone visualized.
Cervix Uterus Adnexa

Cervix
Length:            3.2  cm.
Normal appearance by transabdominal scan.

Uterus
No abnormality visualized.

Left Ovary
Within normal limits.

Right Ovary
Within normal limits.

Cul De Sac:   No free fluid seen.

Adnexa:       No abnormality visualized.
Impression

SIUP at 18+4 weeks
Normal detailed fetal anatomy
Markers of aneuploidy: none
Normal amniotic fluid volume
Measurements consistent with LMP dating
Recommendations

Follow-up ultrasound for growth in 6 weeks

## 2018-05-06 IMAGING — US US MFM FETAL BPP W/O NON-STRESS
1 series · 16 of 22 positions shown · non-contrast
Comparison: none

[Series 1: us mfm fetal bpp w/o non-stress · 22 acquisitions, 16 frames shown]
[im 1/22]
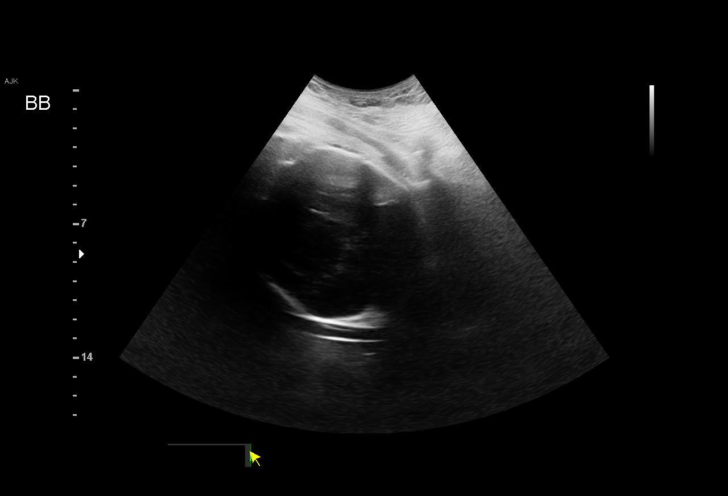
[im 3/22]
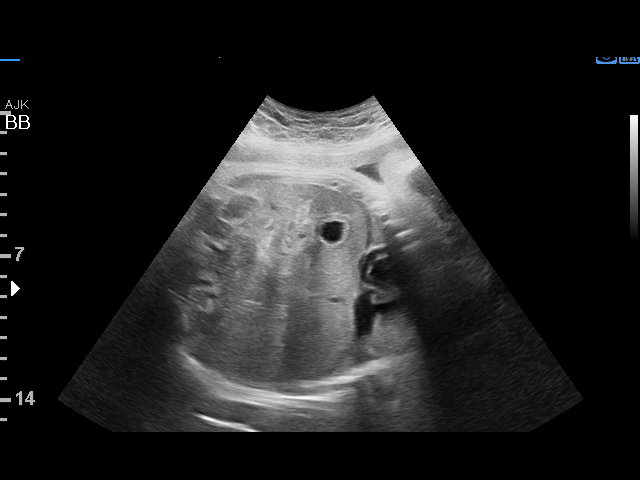
[im 4/22]
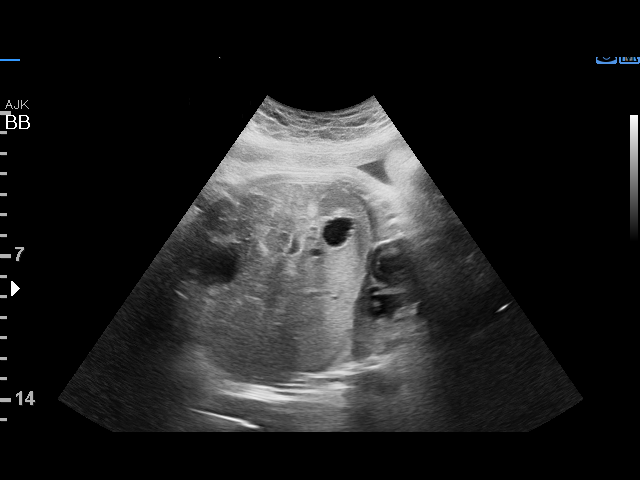
[im 5/22]
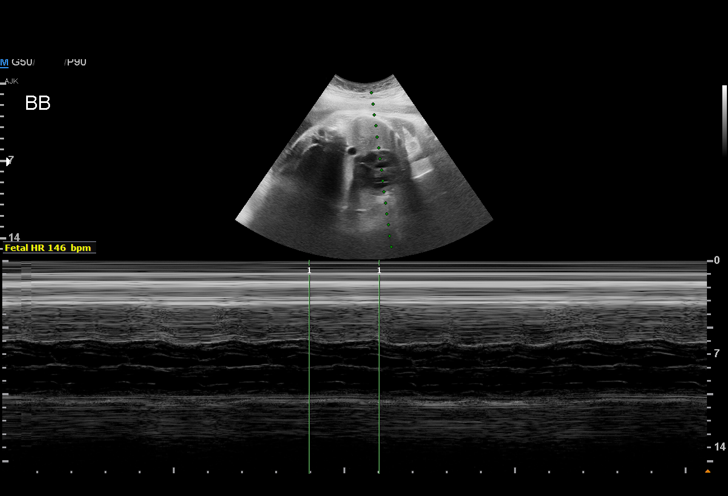
[im 7/22]
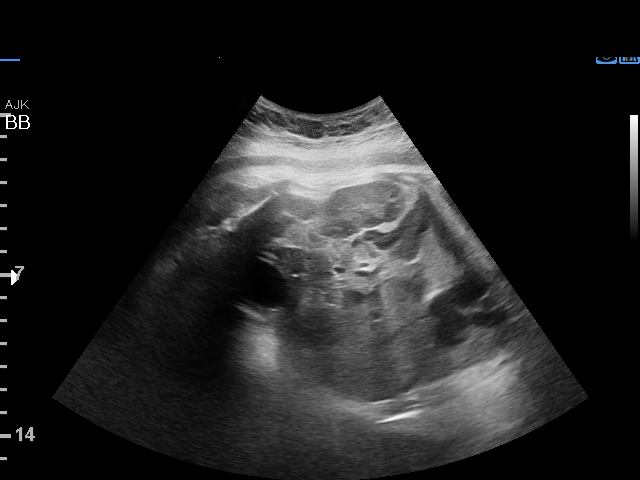
[im 8/22]
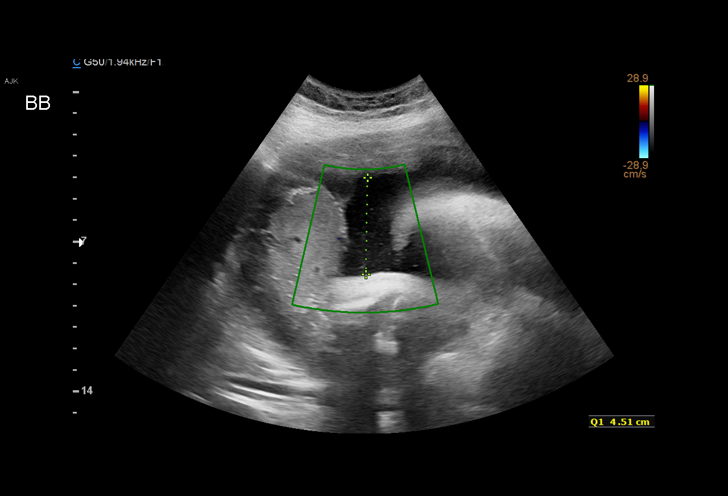
[im 9/22]
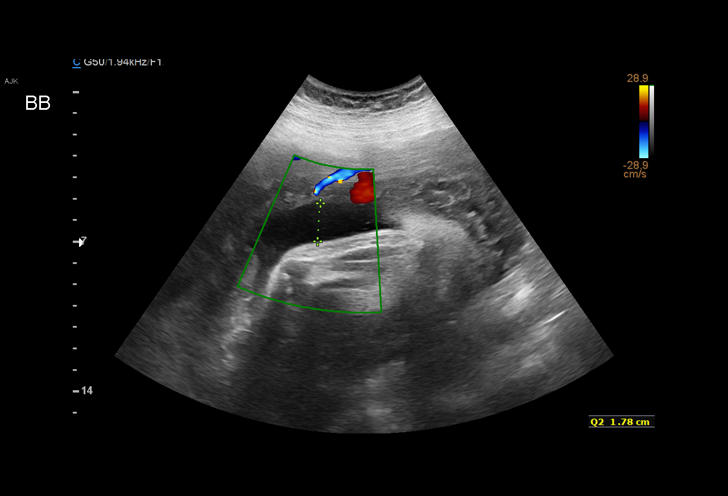
[im 11/22]
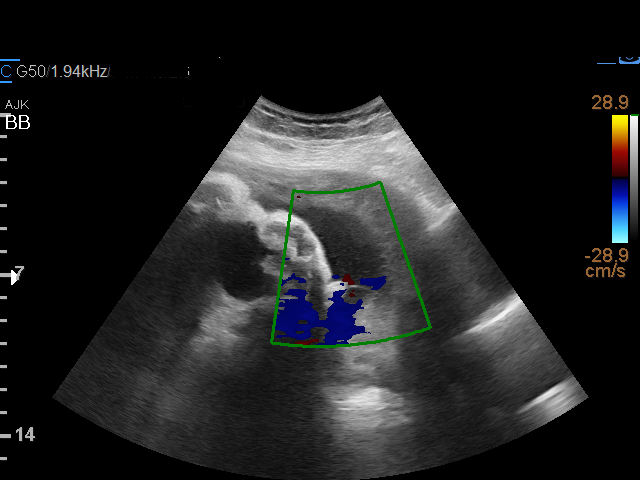
[im 12/22]
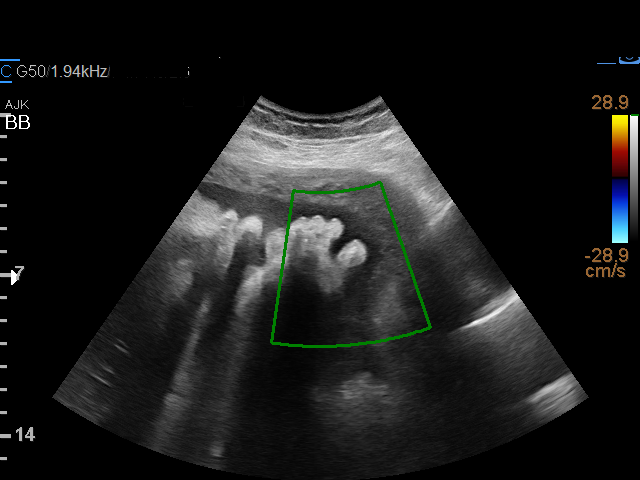
[im 14/22]
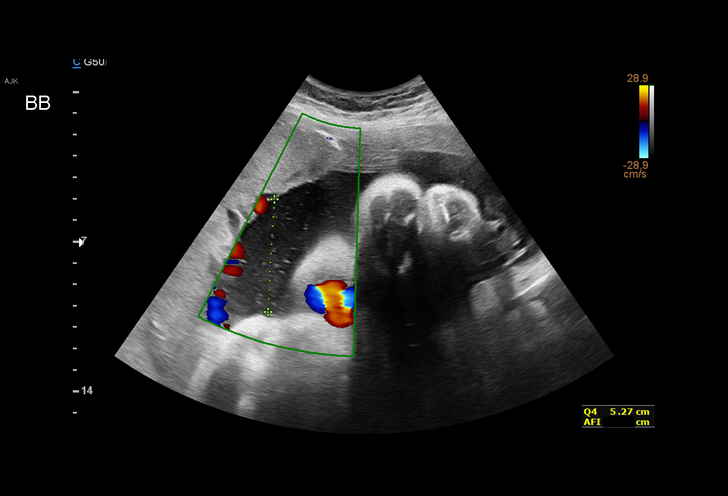
[im 15/22]
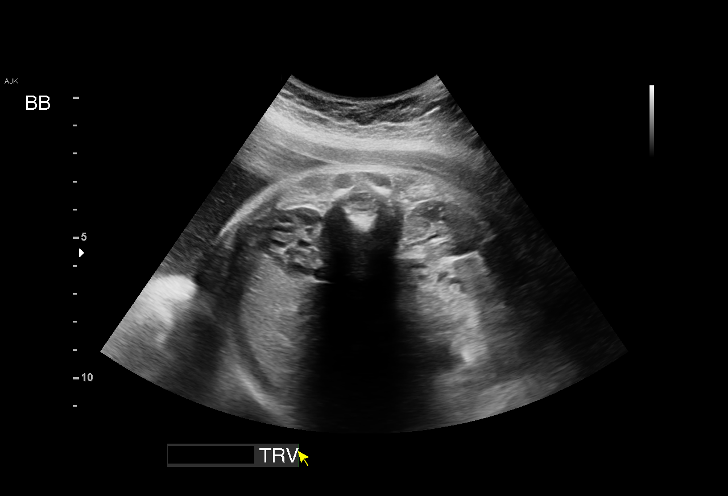
[im 16/22]
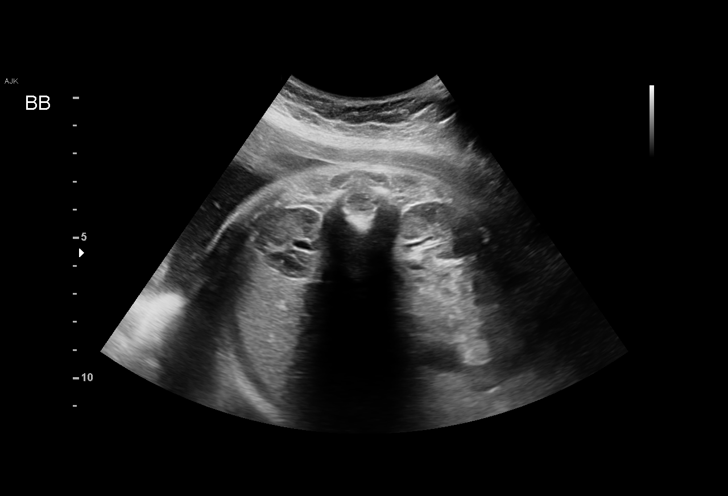
[im 18/22]
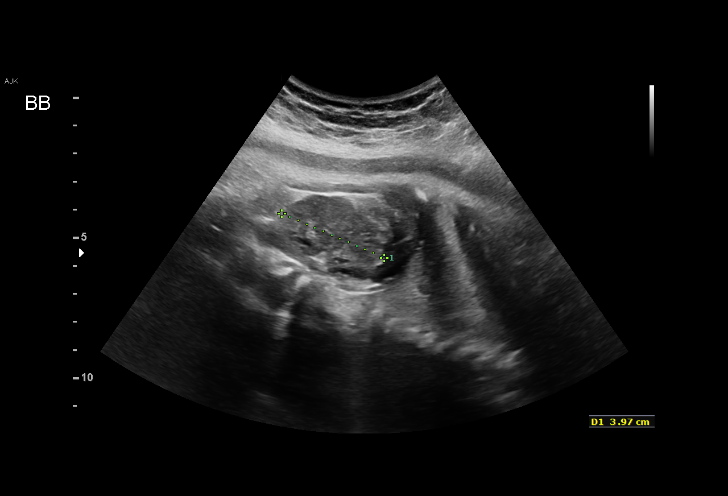
[im 19/22]
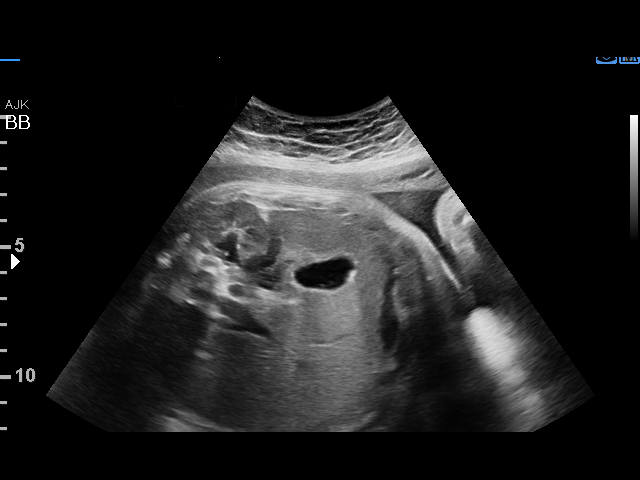
[im 20/22]
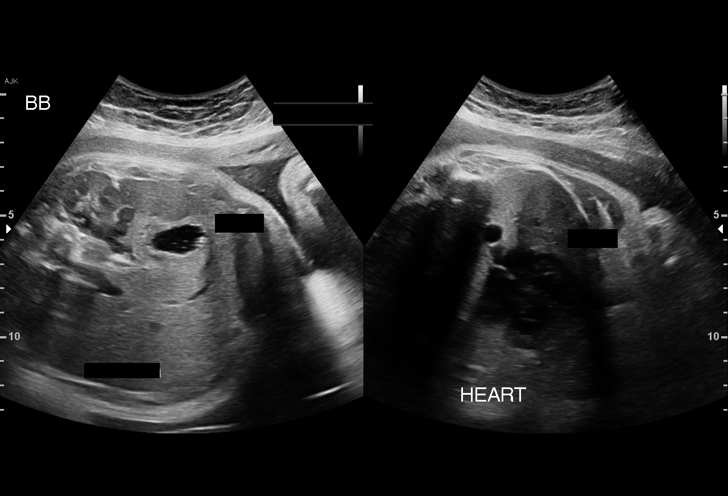
[im 22/22]
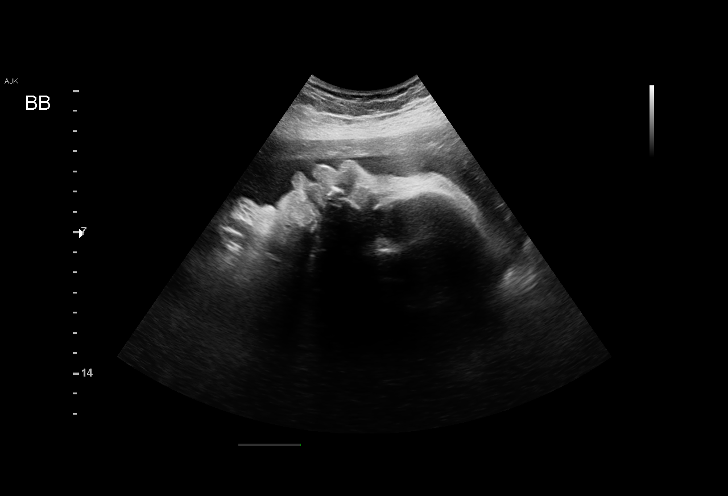

[16 of 22 positions shown; findings below may reference images not displayed]

[REDACTED]care - [HOSPITAL]

1  JASMEEN MARIE              [id number]      [id number]     [id number]
Indications

38 weeks gestation of pregnancy
Advanced maternal age multigravida 35+
(41), third trimester; testing offered and
declined
Encounter for other antenatal screening
follow-up
OB History

Blood Type:            Height:  4'9"   Weight (lb):  144       BMI:
Gravidity:    4         Term:   3        Prem:   0        SAB:   0
TOP:          0       Ectopic:  0        Living: 3
Fetal Evaluation

Num Of Fetuses:     1
Fetal Heart         146
Rate(bpm):
Cardiac Activity:   Observed
Presentation:       Cephalic

Amniotic Fluid
AFI FV:      Subjectively within normal limits

AFI Sum(cm)     %Tile       Largest Pocket(cm)
13.4            52

RUQ(cm)       RLQ(cm)       LUQ(cm)        LLQ(cm)
5.27
Biophysical Evaluation

Amniotic F.V:   Within normal limits       F. Tone:        Observed
F. Movement:    Observed                   Score:          [DATE]
F. Breathing:   Observed
Gestational Age

LMP:           38w 3d        Date:  10/08/16                 EDD:   07/15/17
Best:          38w 3d     Det. By:  LMP  (10/08/16)          EDD:   07/15/17
Impression

Intrauterine pregnancy at 38+3 weeks with AMA
Normal amniotic fluid
BPP [DATE]
Recommendations

For BPP in office 07/11/17, so no further visits are scheduled
here in [facility]

## 2018-05-13 IMAGING — US US MFM OB FOLLOW-UP
1 series · 14 of 28 positions shown · non-contrast
Comparison: none

[Series 1: us mfm ob follow-up · 37 acquisitions, 14 frames shown]
[im 2/37]
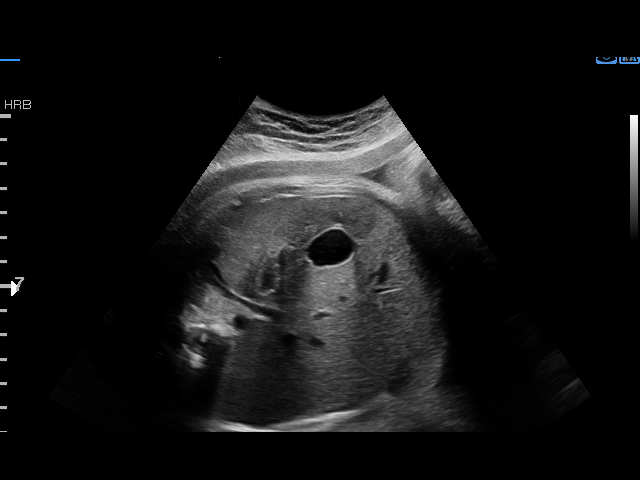
[im 5/37]
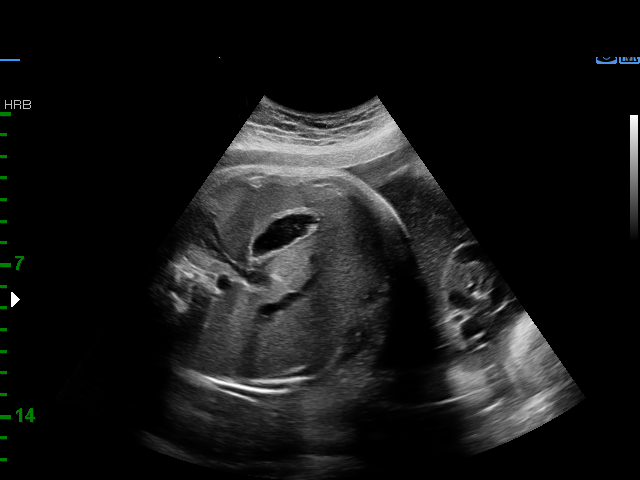
[im 7/37]
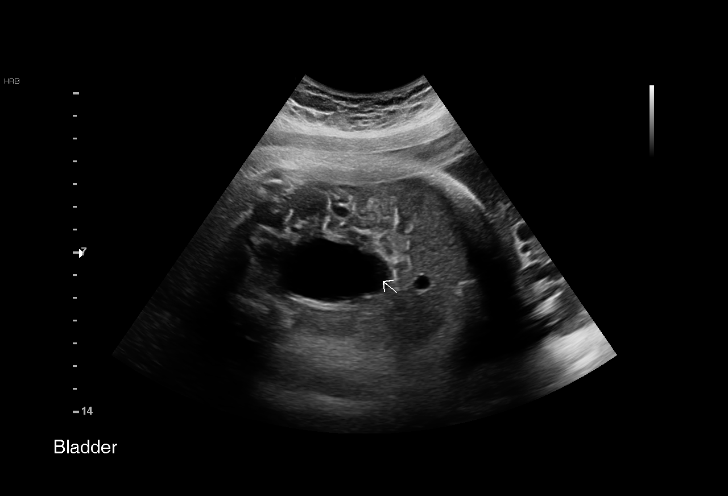
[im 10/37]
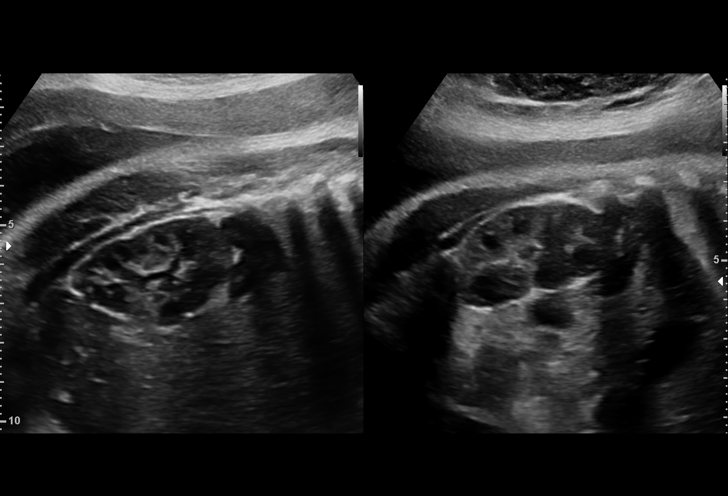
[im 13/37]
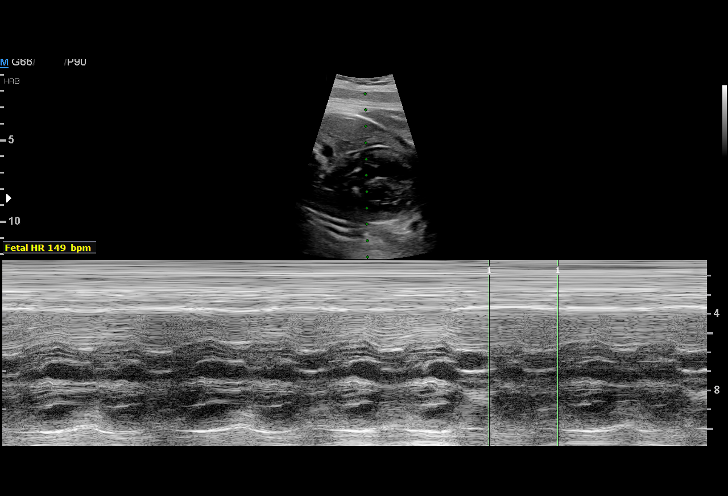
[im 15/37]
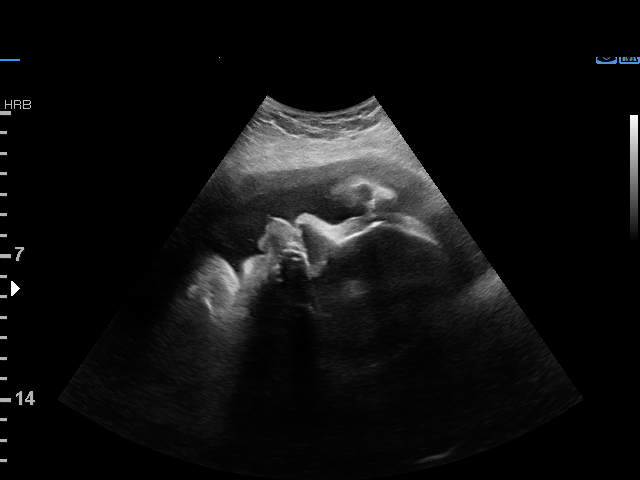
[im 18/37]
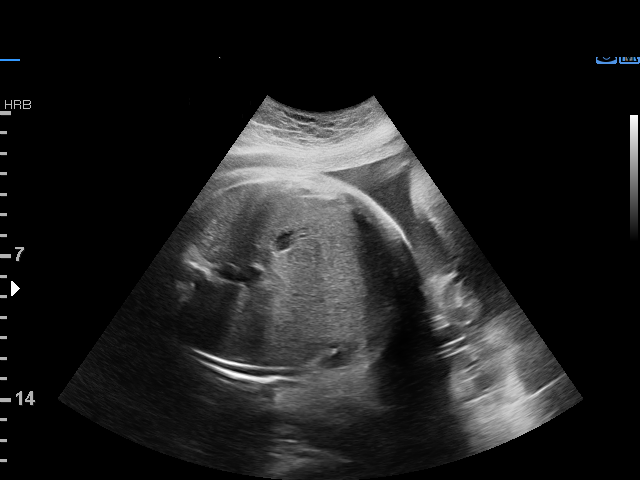
[im 21/37]
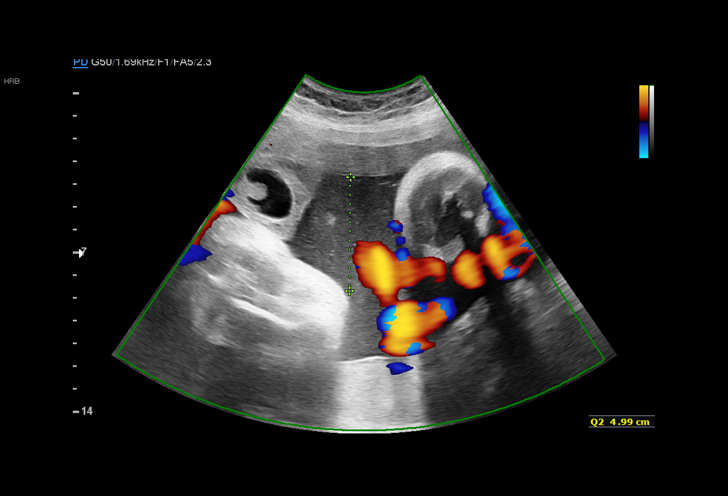
[im 23/37]
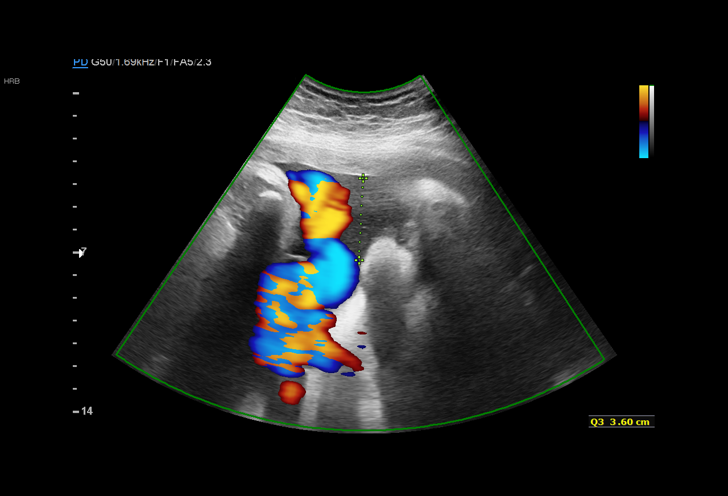
[im 26/37]
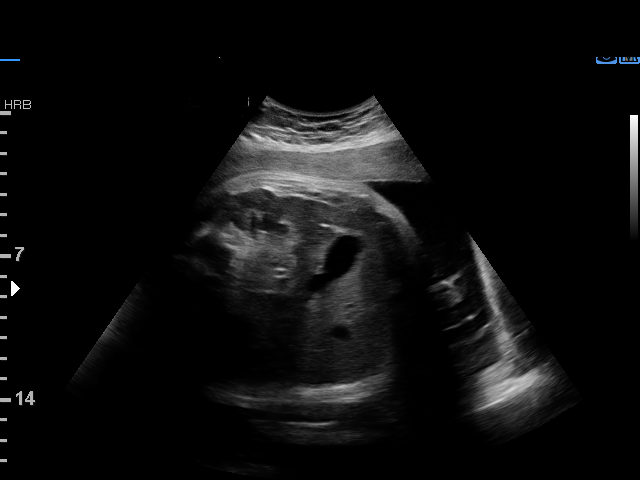
[im 29/37]
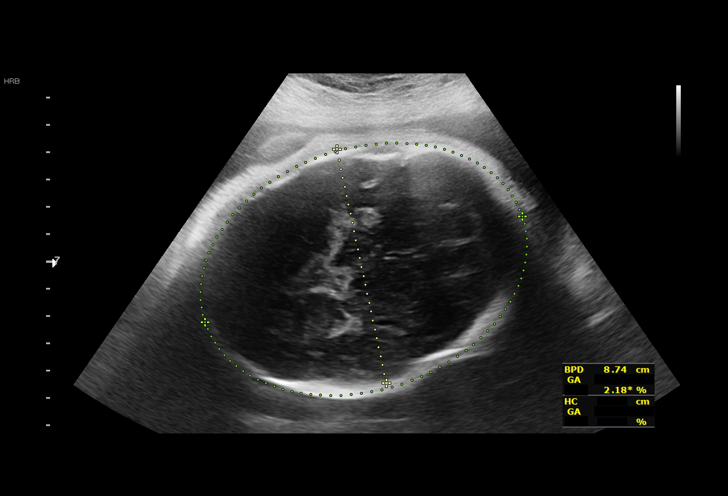
[im 31/37]
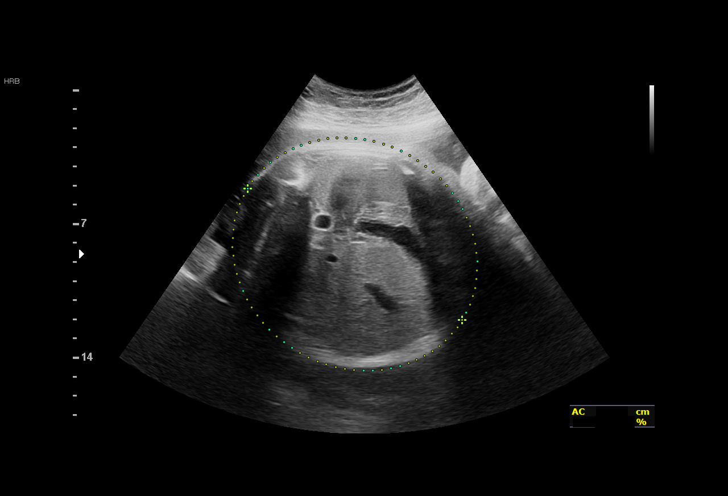
[im 34/37]
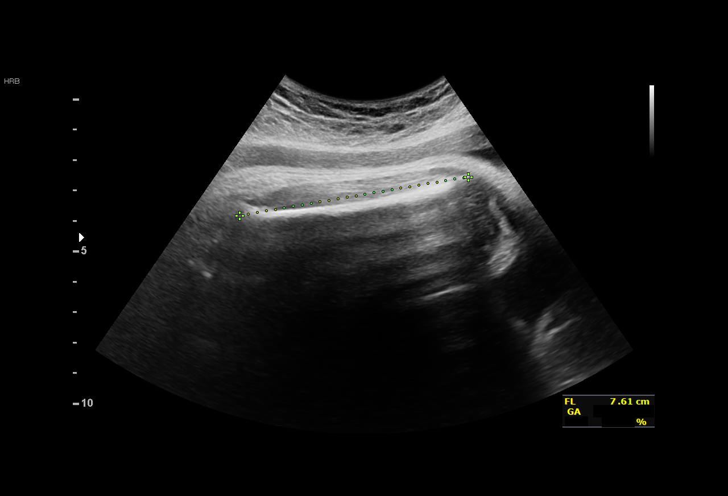
[im 37/37]
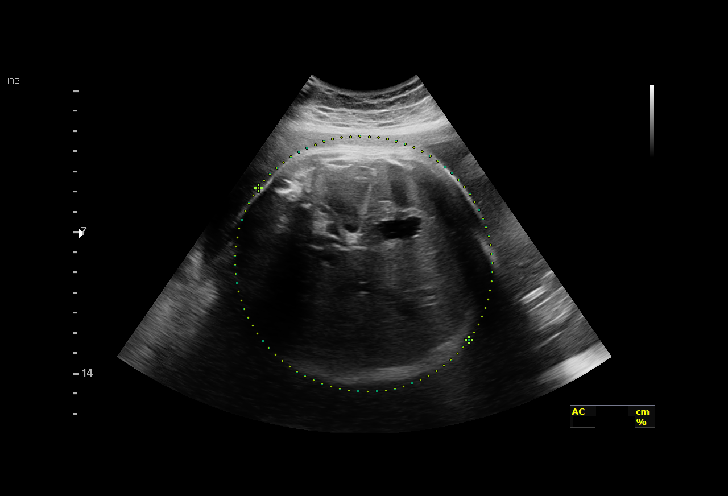

[14 of 28 positions shown; findings below may reference images not displayed]

[REDACTED]care - [HOSPITAL]

1  CHAI TIGER           [id number]      [id number]     [id number]
2  KWANEKAMBA GWA CHALLY            [id number]      [id number]     [id number]
Indications

39 weeks gestation of pregnancy
Advanced maternal age multigravida 35+
(41), third trimester; testing offered and
declined
OB History

Blood Type:            Height:  4'9"   Weight (lb):  144       BMI:
Gravidity:    4         Term:   3        Prem:   0        SAB:   0
TOP:          0       Ectopic:  0        Living: 3
Fetal Evaluation

Num Of Fetuses:     1
Fetal Heart         149
Rate(bpm):
Cardiac Activity:   Observed
Presentation:       Cephalic
Placenta:           Posterior, above cervical os
P. Cord Insertion:  Previously Visualized

Amniotic Fluid
AFI FV:      Subjectively within normal limits

AFI Sum(cm)     %Tile       Largest Pocket(cm)
17.59           74

RUQ(cm)       RLQ(cm)       LUQ(cm)        LLQ(cm)
6.42
Biophysical Evaluation

Amniotic F.V:   Within normal limits       F. Tone:        Observed
F. Movement:    Observed                   Score:          [DATE]
F. Breathing:   Observed
Biometry

BPD:      87.3  mm     G. Age:  35w 2d          2  %    CI:        69.56   %    70 - 86
FL/HC:      22.6   %    20.6 -
HC:      334.1  mm     G. Age:  38w 1d         17  %    HC/AC:      0.84        0.87 -
AC:      395.6  mm     G. Age:  N/A          > 97  %    FL/BPD:     86.4   %    71 - 87
FL:       75.4  mm     G. Age:  38w 4d         38  %    FL/AC:      19.1   %    20 - 24
HUM:      63.1  mm     G. Age:  36w 5d         24  %

Est. FW:    3211  gm      9 lb 4 oz   > 90  %
Gestational Age

LMP:           39w 3d        Date:  10/08/16                 EDD:   07/15/17
U/S Today:     37w 2d                                        EDD:   07/30/17
Best:          39w 3d     Det. By:  LMP  (10/08/16)          EDD:   07/15/17
Anatomy

Cranium:               Appears normal         Aortic Arch:            Previously seen
Cavum:                 Appears normal         Ductal Arch:            Previously seen
Ventricles:            Previously seen        Diaphragm:              Appears normal
Choroid Plexus:        Previously seen        Stomach:                Appears normal, left
sided
Cerebellum:            Previously seen        Abdomen:                Appears normal
Posterior Fossa:       Previously seen        Abdominal Wall:         Previously seen
Nuchal Fold:           Previously seen        Cord Vessels:           Previously seen
Face:                  Orbits and profile     Kidneys:                Appear normal
previously seen
Lips:                  Previously seen        Bladder:                Appears normal
Thoracic:              Appears normal         Spine:                  Previously seen
Heart:                 Appears normal         Upper Extremities:      Previously seen
(4CH, axis, and situs
RVOT:                  Previously seen        Lower Extremities:      Previously seen
LVOT:                  Previously seen

Other:  Male gender. Heels, 5th digit, and Nasal bone previously visualized.
Technically difficult due to advanced GA and fetal position.
Cervix Uterus Adnexa

Cervix
Not visualized (advanced GA >31wks)
Impression

Single living intrauterine pregnancy at 39 weeks 3 days.
Greater than expected fetal growth (>90%).
Normal amniotic fluid volume.
Normal interval fetal anatomy.
BPP [DATE].
Recommendations

Ms. Victor M. Astacio is scheduled for an IOL next week.

## 2019-08-02 ENCOUNTER — Ambulatory Visit: Payer: Self-pay | Attending: Internal Medicine

## 2019-08-02 ENCOUNTER — Ambulatory Visit: Payer: Self-pay

## 2019-08-02 DIAGNOSIS — Z23 Encounter for immunization: Secondary | ICD-10-CM

## 2019-08-02 NOTE — Progress Notes (Signed)
   Covid-19 Vaccination Clinic  Name:  Dominique Caldwell    MRN: [id number] DOB: May 20, 1975  08/02/2019  [name] was observed post Covid-19 immunization for 15 minutes without incident. She was provided with Vaccine Information Sheet and instruction to access the V-Safe system.   [name] was instructed to call 911 with any severe reactions post vaccine: Marland Kitchen Difficulty breathing  . Swelling of face and throat  . A fast heartbeat  . A bad rash all over body  . Dizziness and weakness   Immunizations Administered    Name Date Dose VIS Date Route   Pfizer COVID-19 Vaccine 08/02/2019  8:26 AM 0.3 mL 05/28/2018 Intramuscular   Manufacturer: [place]   Lot: [id number]   [place]: [id number]

## 2019-08-25 ENCOUNTER — Ambulatory Visit: Payer: Self-pay | Attending: Internal Medicine

## 2019-08-25 DIAGNOSIS — Z23 Encounter for immunization: Secondary | ICD-10-CM

## 2019-08-25 NOTE — Progress Notes (Signed)
   Covid-19 Vaccination Clinic  Name:  Dominique Caldwell    MRN: [id number] DOB: 05/03/1975  08/25/2019  [name] was observed post Covid-19 immunization for 15 minutes without incident. She was provided with Vaccine Information Sheet and instruction to access the V-Safe system.   [name] was instructed to call 911 with any severe reactions post vaccine: Marland Kitchen Difficulty breathing  . Swelling of face and throat  . A fast heartbeat  . A bad rash all over body  . Dizziness and weakness   Immunizations Administered    Name Date Dose VIS Date Route   Pfizer COVID-19 Vaccine 08/25/2019  4:52 PM 0.3 mL 05/28/2018 Intramuscular   Manufacturer: [place]   Lot: [id number]   [place]: [id number]

## 2022-03-16 ENCOUNTER — Other Ambulatory Visit: Payer: Self-pay | Admitting: Obstetrics and Gynecology

## 2022-03-16 DIAGNOSIS — Z1231 Encounter for screening mammogram for malignant neoplasm of breast: Secondary | ICD-10-CM

## 2022-05-18 ENCOUNTER — Ambulatory Visit: Payer: Self-pay | Admitting: Hematology and Oncology

## 2022-05-18 ENCOUNTER — Ambulatory Visit
Admission: RE | Admit: 2022-05-18 | Discharge: 2022-05-18 | Disposition: A | Discharge status: Home / Self Care | Payer: No Typology Code available for payment source | Source: Ambulatory Visit | Attending: Obstetrics and Gynecology | Admitting: Obstetrics and Gynecology

## 2022-05-18 VITALS — BP 131/75 | Wt 132.0 lb

## 2022-05-18 DIAGNOSIS — Z01419 Encounter for gynecological examination (general) (routine) without abnormal findings: Secondary | ICD-10-CM

## 2022-05-18 DIAGNOSIS — Z1231 Encounter for screening mammogram for malignant neoplasm of breast: Secondary | ICD-10-CM

## 2022-05-18 DIAGNOSIS — Z1211 Encounter for screening for malignant neoplasm of colon: Secondary | ICD-10-CM

## 2022-05-18 NOTE — Patient Instructions (Signed)
Taught Dillynn Schullo about self breast awareness and gave educational materials to take home. Patient did not need a Pap smear today due to last Pap smear was in 2018 per patient. Let her know [facility] will cover Pap smears every 5 years unless has a history of abnormal Pap smears. Referred patient to the [facility] for screening mammogram. Appointment scheduled for 05/18/22. Patient aware of appointment and will be there. Let patient know will follow up with her within the next couple weeks with results. Pang Godinho verbalized understanding.  Melodye Ped, NP 1:43 PM

## 2022-05-18 NOTE — Progress Notes (Signed)
Dominique Caldwell is a 47 y.o. [phone] female who presents to [facility] clinic today with no complaints.    Pap Smear: Pap smear completed today. Last Pap smear was 2018 and was normal. Per patient has no history of an abnormal Pap smear. Last Pap smear result is available in Epic.   Physical exam: Breasts Breasts symmetrical. No skin abnormalities bilateral breasts. No nipple retraction bilateral breasts. No nipple discharge bilateral breasts. No lymphadenopathy. No lumps palpated bilateral breasts.       Pelvic/Bimanual Ext Genitalia No lesions, no swelling and no discharge observed on external genitalia.        Vagina Vagina pink and normal texture. No lesions or discharge observed in vagina.        Cervix Cervix is present. Cervix pink and of normal texture. No discharge observed.    Uterus Uterus is present and palpable. Uterus in normal position and normal size.        Adnexae Bilateral ovaries present and palpable. No tenderness on palpation.         Rectovaginal No rectal exam completed today since patient had no rectal complaints. No skin abnormalities observed on exam.     Smoking History: Patient has never smoked and was not referred to quit line.    Patient Navigation: Patient education provided. Access to services provided for patient through [place] interpreter provided. No transportation provided   Colorectal Cancer Screening: Per patient has never had colonoscopy completed No complaints today. FIT test given.   Breast and Cervical Cancer Risk Assessment: Patient does not have family history of breast cancer, known genetic mutations, or radiation treatment to the chest before age 78. Patient does not have history of cervical dysplasia, immunocompromised, or DES exposure in-utero.  Risk Assessment   No risk assessment data     A: [facility] exam with pap smear No complaints with benign exams.   P: Referred patient to the [facility] for a screening mammogram. Appointment scheduled 05/18/22.  Dayton Scrape A, NP 05/18/2022 1:48 PM

## 2022-05-22 LAB — CYTOLOGY - PAP
Adequacy: ABSENT
Comment: NEGATIVE
Diagnosis: NEGATIVE
High risk HPV: NEGATIVE
